# Patient Record
Sex: Male | Born: 1965 | Race: Black or African American | Hispanic: No | Marital: Single | Smoking: Never smoker
Health system: Southern US, Community
[De-identification: ages and names within clinical notes are randomized; demographics above are authoritative.]

## PROBLEM LIST (undated history)

## (undated) DIAGNOSIS — G4733 Obstructive sleep apnea (adult) (pediatric): Secondary | ICD-10-CM

## (undated) DIAGNOSIS — I10 Essential (primary) hypertension: Secondary | ICD-10-CM

## (undated) HISTORY — DX: Essential (primary) hypertension: I10

## (undated) HISTORY — DX: Obstructive sleep apnea (adult) (pediatric): G47.33

---

## 2001-07-02 ENCOUNTER — Encounter: Payer: Self-pay | Admitting: Emergency Medicine

## 2001-07-02 ENCOUNTER — Emergency Department (HOSPITAL_COMMUNITY): Admission: EM | Admit: 2001-07-02 | Discharge: 2001-07-02 | Payer: Self-pay | Admitting: Emergency Medicine

## 2002-12-02 ENCOUNTER — Emergency Department (HOSPITAL_COMMUNITY): Admission: EM | Admit: 2002-12-02 | Discharge: 2002-12-02 | Payer: Self-pay | Admitting: Emergency Medicine

## 2002-12-02 ENCOUNTER — Encounter: Payer: Self-pay | Admitting: Emergency Medicine

## 2004-10-01 ENCOUNTER — Ambulatory Visit: Payer: Self-pay | Admitting: Family Medicine

## 2005-03-21 ENCOUNTER — Ambulatory Visit: Payer: Self-pay | Admitting: Family Medicine

## 2005-04-12 ENCOUNTER — Ambulatory Visit: Payer: Self-pay | Admitting: Family Medicine

## 2005-10-31 ENCOUNTER — Ambulatory Visit: Payer: Self-pay | Admitting: Family Medicine

## 2006-01-03 ENCOUNTER — Ambulatory Visit: Payer: Self-pay | Admitting: Family Medicine

## 2006-02-20 ENCOUNTER — Ambulatory Visit: Payer: Self-pay | Admitting: Family Medicine

## 2006-04-22 ENCOUNTER — Ambulatory Visit: Payer: Self-pay | Admitting: Family Medicine

## 2006-05-05 ENCOUNTER — Ambulatory Visit: Payer: Self-pay | Admitting: Family Medicine

## 2006-07-15 ENCOUNTER — Ambulatory Visit: Payer: Self-pay | Admitting: Family Medicine

## 2007-01-14 ENCOUNTER — Ambulatory Visit: Payer: Self-pay | Admitting: Family Medicine

## 2007-02-03 ENCOUNTER — Ambulatory Visit: Payer: Self-pay | Admitting: Family Medicine

## 2007-08-26 DIAGNOSIS — I10 Essential (primary) hypertension: Secondary | ICD-10-CM

## 2007-08-26 DIAGNOSIS — G43909 Migraine, unspecified, not intractable, without status migrainosus: Secondary | ICD-10-CM | POA: Insufficient documentation

## 2008-01-21 ENCOUNTER — Telehealth: Payer: Self-pay | Admitting: Family Medicine

## 2008-06-14 ENCOUNTER — Ambulatory Visit: Payer: Self-pay | Admitting: Family Medicine

## 2008-06-14 DIAGNOSIS — J209 Acute bronchitis, unspecified: Secondary | ICD-10-CM

## 2008-08-05 ENCOUNTER — Telehealth: Payer: Self-pay | Admitting: Family Medicine

## 2008-08-05 ENCOUNTER — Encounter: Payer: Self-pay | Admitting: Family Medicine

## 2008-08-17 ENCOUNTER — Telehealth: Payer: Self-pay | Admitting: Family Medicine

## 2008-08-19 ENCOUNTER — Ambulatory Visit: Payer: Self-pay | Admitting: Family Medicine

## 2009-03-14 ENCOUNTER — Ambulatory Visit: Payer: Self-pay | Admitting: Family Medicine

## 2009-03-14 DIAGNOSIS — L723 Sebaceous cyst: Secondary | ICD-10-CM

## 2009-07-12 ENCOUNTER — Encounter: Payer: Self-pay | Admitting: Family Medicine

## 2009-09-01 ENCOUNTER — Telehealth: Payer: Self-pay | Admitting: Family Medicine

## 2010-01-25 ENCOUNTER — Ambulatory Visit: Payer: Self-pay | Admitting: Family Medicine

## 2010-09-07 ENCOUNTER — Ambulatory Visit: Payer: Self-pay | Admitting: Family Medicine

## 2010-09-07 ENCOUNTER — Encounter: Payer: Self-pay | Admitting: Family Medicine

## 2010-09-07 DIAGNOSIS — R000 Tachycardia, unspecified: Secondary | ICD-10-CM

## 2010-09-07 DIAGNOSIS — F411 Generalized anxiety disorder: Secondary | ICD-10-CM

## 2010-09-10 LAB — CONVERTED CEMR LAB
ALT: 28 units/L (ref 0–53)
AST: 26 units/L (ref 0–37)
Albumin: 4.1 g/dL (ref 3.5–5.2)
Alkaline Phosphatase: 81 units/L (ref 39–117)
BUN: 10 mg/dL (ref 6–23)
Basophils Absolute: 0 10*3/uL (ref 0.0–0.1)
Basophils Relative: 0.3 % (ref 0.0–3.0)
Bilirubin, Direct: 0.1 mg/dL (ref 0.0–0.3)
CO2: 33 meq/L — ABNORMAL HIGH (ref 19–32)
Calcium: 9.4 mg/dL (ref 8.4–10.5)
Chloride: 102 meq/L (ref 96–112)
Creatinine, Ser: 1.3 mg/dL (ref 0.4–1.5)
Eosinophils Absolute: 0.1 10*3/uL (ref 0.0–0.7)
Eosinophils Relative: 0.7 % (ref 0.0–5.0)
GFR calc non Af Amer: 77.56 mL/min (ref >60)
Glucose, Bld: 73 mg/dL (ref 70–99)
HCT: 46.9 % (ref 39.0–52.0)
Hemoglobin: 15.9 g/dL (ref 13.0–17.0)
Lymphocytes Relative: 21.1 % (ref 12.0–46.0)
Lymphs Abs: 2.4 10*3/uL (ref 0.7–4.0)
MCHC: 33.8 g/dL (ref 30.0–36.0)
MCV: 91.2 fL (ref 78.0–100.0)
Monocytes Absolute: 1 10*3/uL (ref 0.1–1.0)
Monocytes Relative: 8.5 % (ref 3.0–12.0)
Neutro Abs: 7.9 10*3/uL — ABNORMAL HIGH (ref 1.4–7.7)
Neutrophils Relative %: 69.4 % (ref 43.0–77.0)
Platelets: 249 10*3/uL (ref 150.0–400.0)
Potassium: 3.8 meq/L (ref 3.5–5.1)
RBC: 5.15 M/uL (ref 4.22–5.81)
RDW: 14.1 % (ref 11.5–14.6)
Sodium: 141 meq/L (ref 135–145)
TSH: 1.58 microintl units/mL (ref 0.35–5.50)
Total Bilirubin: 0.9 mg/dL (ref 0.3–1.2)
Total Protein: 7.7 g/dL (ref 6.0–8.3)
WBC: 11.4 10*3/uL — ABNORMAL HIGH (ref 4.5–10.5)

## 2010-12-13 NOTE — Assessment & Plan Note (Signed)
Summary: fatigue/irregular heartbeat/cjr   Vital Signs:  Patient profile:   45 year old male Weight:      293 pounds O2 Sat:      99 % Temp:     98.6 degrees F Pulse rate:   84 / minute BP sitting:   140 / 100  (left arm) Cuff size:   large  Vitals Entered By: Pura Spice, RN (September 07, 2010 1:54 PM) CC: under stress at work fatigue , states "almost tore bathroom down at work "  states feels like heart racing    History of Present Illness: Here complaining of one week of elevated BP and sensations of his heart racing. No SOB or chest pains. He has been under a lot of stress, both from family issues and work issues. His mother is ill with lymphoma and lung disease, and she has been in and out of the hospital for the past month. 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 In fact 2 days ago he got so upset at work that he went into a restroom and punched a stall door. Fortunately he was not injured. He has arranged with an EAP counselor to meet next Monday, and he wants to take a week off work. He slepps well but feels tired all the time. He plays some golf but gets no other exercise. His migraines have been worse lately, and he has been missing an average of one day of work a week due to his migraines.   Allergies: No Known Drug Allergies  Past History:  Past Medical History: Reviewed history from 01/25/2010 and no changes required. Hypertension migraine, sees the [facility]  Past Surgical History: Reviewed history from 06/14/2008 and no changes required. Denies surgical history  Review of Systems  The patient denies anorexia, fever, weight loss, weight gain, vision loss, decreased hearing, hoarseness, chest pain, syncope, dyspnea on exertion, peripheral edema, prolonged cough, hemoptysis, abdominal pain, melena, hematochezia, severe indigestion/heartburn, hematuria, incontinence, genital sores, muscle weakness, suspicious skin  lesions, transient blindness, difficulty walking, depression, unusual weight change, abnormal bleeding, enlarged lymph nodes, angioedema, breast masses, and testicular masses.    Physical Exam  General:  overweight-appearing.   Neck:  No deformities, masses, or tenderness noted. Lungs:  Normal respiratory effort, chest expands symmetrically. Lungs are clear to auscultation, no crackles or wheezes. Heart:  Normal rate and regular rhythm. S1 and S2 normal without gallop, murmur, click, rub or other extra sounds. EKG normal  Neurologic:  alert & oriented X3, cranial nerves II-XII intact, and gait normal.   Psych:  Oriented X3, memory intact for recent and remote, normally interactive, good eye contact, depressed affect, and tearful.     Impression & Recommendations:  Problem # 1:  TACHYCARDIA (ICD-785.0)  Orders: UA Dipstick w/o Micro (automated)  (81003) Venipuncture (98119) TLB-BMP (Basic Metabolic Panel-BMET) (80048-METABOL) TLB-CBC Platelet - w/Differential (85025-CBCD) TLB-Hepatic/Liver Function Pnl (80076-HEPATIC) TLB-TSH (Thyroid Stimulating Hormone) (84443-TSH)  Problem # 2:  ANXIETY STATE, UNSPECIFIED (ICD-300.00)  Problem # 3:  HYPERTENSION (ICD-401.9)  His updated medication list for this problem includes:    Benazepril-hydrochlorothiazide 20-25 Mg Tabs (Benazepril-hydrochlorothiazide) ..... Once daily  Complete Medication List: 1)  Benazepril-hydrochlorothiazide 20-25 Mg Tabs (Benazepril-hydrochlorothiazide) .... Once daily  Patient Instructions: 1)  We will write him out of work from today until 09-15-10. he will see the EAP counselor next week. I advised him to start on a medication  for anxiety and depression, but he  is resistant to this idea. He will think about it and get back with me. I think the palpitations are from stress and do not represent a true cardiac problem. We will continue to watch this closely.  2)  Please schedule a follow-up appointment in 2 weeks.     Orders Added: 1)  Est. Patient Level IV [91478] 2)  UA Dipstick w/o Micro (automated)  [81003] 3)  Venipuncture [36415] 4)  TLB-BMP (Basic Metabolic Panel-BMET) [80048-METABOL] 5)  TLB-CBC Platelet - w/Differential [85025-CBCD] 6)  TLB-Hepatic/Liver Function Pnl [80076-HEPATIC] 7)  TLB-TSH (Thyroid Stimulating Hormone) [29562-ZHY]  Appended Document: fatigue/irregular heartbeat/cjr  Laboratory Results   Urine Tests    Routine Urinalysis   Color: yellow Appearance: Clear Glucose: negative   (Normal Range: Negative) Bilirubin: negative   (Normal Range: Negative) Ketone: negative   (Normal Range: Negative) Spec. Gravity: 1.020   (Normal Range: 1.003-1.035) Blood: negative   (Normal Range: Negative) pH: 6.5   (Normal Range: 5.0-8.0) Protein: negative   (Normal Range: Negative) Urobilinogen: 0.2   (Normal Range: 0-1) Nitrite: negative   (Normal Range: Negative) Leukocyte Esterace: negative   (Normal Range: Negative)    Comments: Rita Ohara  September 07, 2010 2:46 PM

## 2010-12-13 NOTE — Assessment & Plan Note (Signed)
Summary: MED CK / REFILL // RS   Vital Signs:  Patient profile:   45 year old male Weight:      294 pounds BMI:     35.92 Temp:     98.4 degrees F oral BP sitting:   130 / 92  (left arm) Cuff size:   large  Vitals Entered By: Raechel Ache, RN (January 25, 2010 11:07 AM) CC: Med check. Had URI last week and still has cough.   History of Present Illness: Here for refills on BP meds. He has not checked his BP in a very long time. he feels fine. His weight is stable.  Allergies: No Known Drug Allergies  Past History:  Past Medical History: Hypertension migraine, sees the [facility]  Review of Systems  The patient denies anorexia, fever, weight loss, weight gain, vision loss, decreased hearing, hoarseness, chest pain, syncope, dyspnea on exertion, peripheral edema, prolonged cough, headaches, hemoptysis, abdominal pain, melena, hematochezia, severe indigestion/heartburn, hematuria, incontinence, genital sores, muscle weakness, suspicious skin lesions, transient blindness, difficulty walking, depression, unusual weight change, abnormal bleeding, enlarged lymph nodes, angioedema, breast masses, and testicular masses.    Physical Exam  General:  overweight-appearing.   Neck:  No deformities, masses, or tenderness noted. Lungs:  Normal respiratory effort, chest expands symmetrically. Lungs are clear to auscultation, no crackles or wheezes. Heart:  Normal rate and regular rhythm. S1 and S2 normal without gallop, murmur, click, rub or other extra sounds.   Impression & Recommendations:  Problem # 1:  HYPERTENSION (ICD-401.9)  The following medications were removed from the medication list:    Benazepril-hydrochlorothiazide 20-12.5 Mg Tabs (Benazepril-hydrochlorothiazide) ..... Once daily His updated medication list for this problem includes:    Benazepril-hydrochlorothiazide 20-25 Mg Tabs (Benazepril-hydrochlorothiazide) ..... Once daily  Complete Medication List: 1)   Benazepril-hydrochlorothiazide 20-25 Mg Tabs (Benazepril-hydrochlorothiazide) .... Once daily  Patient Instructions: 1)  It is important that you exercise reguarly at least 20 minutes 5 times a week. If you develop chest pain, have severe difficulty breathing, or feel very tired, stop exercising immediately and seek medical attention.  2)  You need to lose weight. Consider a lower calorie diet and regular exercise.  3)  Adjust meds as above. 4)  Set up a cpx soon Prescriptions: BENAZEPRIL-HYDROCHLOROTHIAZIDE 20-25 MG TABS (BENAZEPRIL-HYDROCHLOROTHIAZIDE) once daily  #90 x 3   Entered and Authorized by:   Nelwyn Salisbury MD   Signed by:   Nelwyn Salisbury MD on 01/25/2010   Method used:   Print then Give to Patient   RxID:   [id number]

## 2011-02-05 ENCOUNTER — Encounter: Payer: Self-pay | Admitting: Family Medicine

## 2011-02-06 ENCOUNTER — Encounter: Payer: Self-pay | Admitting: Family Medicine

## 2011-02-06 ENCOUNTER — Ambulatory Visit (INDEPENDENT_AMBULATORY_CARE_PROVIDER_SITE_OTHER): Payer: Federal, State, Local not specified - PPO | Admitting: Family Medicine

## 2011-02-06 VITALS — BP 126/84 | HR 86 | Temp 98.5°F | Wt 302.0 lb

## 2011-02-06 DIAGNOSIS — R5383 Other fatigue: Secondary | ICD-10-CM

## 2011-02-06 DIAGNOSIS — R5381 Other malaise: Secondary | ICD-10-CM

## 2011-02-06 DIAGNOSIS — G4730 Sleep apnea, unspecified: Secondary | ICD-10-CM

## 2011-02-06 LAB — TESTOSTERONE: Testosterone: 329.59 ng/dL — ABNORMAL LOW (ref 350.00–890.00)

## 2011-02-06 LAB — VITAMIN B12: Vitamin B-12: 329 pg/mL (ref 211–911)

## 2011-02-06 NOTE — Progress Notes (Signed)
  Subjective:    Patient ID: Frederick Ferrell, male    DOB: 1965-12-01, 45 y.o.   MRN: [id number]  HPI Here to follow up on migraines and chronic fatigue. He averages missing 8 days of work a month for HAs, and he needs FMLA forms filled out. He has been approved to see a [place] doctor for the migraines but he cannot get in to see them until late May. He continues to feel tired and sleep all the time, and we reviewed the normal cpx labs he had done here a few months ago. He says he sleeps all night long, but he has been told he seems restless and he snores a lot.    Review of Systems  Constitutional: Positive for fatigue.  Respiratory: Negative.   Cardiovascular: Negative.   Neurological: Positive for headaches.       Objective:   Physical Exam  Constitutional: He is oriented to person, place, and time. He appears well-developed and well-nourished.  Neck: No thyromegaly present.  Cardiovascular: Normal rate, regular rhythm, normal heart sounds and intact distal pulses.   Pulmonary/Chest: Effort normal and breath sounds normal.  Lymphadenopathy:    He has no cervical adenopathy.  Neurological: He is alert and oriented to person, place, and time.          Assessment & Plan:  More than likely he has sleep apnea, so we will refer him for a sleep evaluation. I filled out the FMLA forms, and he will follow up with the [place].

## 2011-02-07 ENCOUNTER — Telehealth: Payer: Self-pay

## 2011-02-07 LAB — VITAMIN D 25 HYDROXY (VIT D DEFICIENCY, FRACTURES): Vit D, 25-Hydroxy: 24 ng/mL — ABNORMAL LOW (ref 30–89)

## 2011-02-07 NOTE — Telephone Encounter (Signed)
Pt aware of lab results 

## 2011-02-07 NOTE — Telephone Encounter (Signed)
Message copied by Madison Hickman on Thu Feb 07, 2011  1:08 PM ------      Message from: Dwaine Deter      Created: Thu Feb 07, 2011  8:43 AM       B12 is normal. Vitamin D is low, so start taking 5000 units of OTC Vit. D a day. His testosterone is slightly low but not enough to make him fatigued. Proceed with the sleep apnea workup

## 2011-02-07 NOTE — Telephone Encounter (Signed)
Message copied by Madison Hickman on Thu Feb 07, 2011  3:16 PM ------      Message from: Dwaine Deter      Created: Thu Feb 07, 2011  8:43 AM       B12 is normal. Vitamin D is low, so start taking 5000 units of OTC Vit. D a day. His testosterone is slightly low but not enough to make him fatigued. Proceed with the sleep apnea workup

## 2011-02-28 ENCOUNTER — Other Ambulatory Visit: Payer: Self-pay

## 2011-02-28 NOTE — Telephone Encounter (Signed)
[facility] to [facility]

## 2011-03-01 ENCOUNTER — Encounter: Payer: Self-pay | Admitting: Pulmonary Disease

## 2011-03-01 MED ORDER — BENAZEPRIL-HYDROCHLOROTHIAZIDE 20-25 MG PO TABS: 1.0000 | ORAL_TABLET | Freq: Every day | ORAL | Status: DC

## 2011-03-05 ENCOUNTER — Ambulatory Visit (INDEPENDENT_AMBULATORY_CARE_PROVIDER_SITE_OTHER): Payer: Federal, State, Local not specified - PPO | Admitting: Pulmonary Disease

## 2011-03-05 ENCOUNTER — Encounter: Payer: Self-pay | Admitting: Pulmonary Disease

## 2011-03-05 VITALS — BP 120/80 | HR 64 | Temp 97.9°F | Ht 76.0 in | Wt 310.6 lb

## 2011-03-05 DIAGNOSIS — G4733 Obstructive sleep apnea (adult) (pediatric): Secondary | ICD-10-CM

## 2011-03-05 NOTE — Patient Instructions (Signed)
Will schedule for home sleep testing vs formal study at sleep center, depending on what insurance allows.  Work on [employer] loss Will arrange followup once results available.

## 2011-03-05 NOTE — Assessment & Plan Note (Signed)
The pt's history is very suggestive of sleep apnea, but may be just be symptomatic snoring.  He will need sleep study for diagnosis.  I have had a long discussion with the pt about sleep apnea, including its impact on QOL and CV health.  He is agreeable to pursuing workup.

## 2011-03-05 NOTE — Progress Notes (Signed)
  Subjective:    Patient ID: Frederick Ferrell, male    DOB: 1966-07-21, 45 y.o.   MRN: [id number]  HPI The pt is a 45y/o male who I have been asked to see for possible osa.  His history is + for the following: -witnessed apneas with loud snoring -nonrestorative sleep -denies significant EDS, but does feel "tired" all of the time -weight neutral over the last 2 yrs, epworth 8.  Sleep Questionnaire: What time do you typically go to bed?( Between what hours) 10:30 pm to 11:30 pm How long does it take you to fall asleep? instantly How many times during the night do you wake up? 1 What time do you get out of bed to start your day? 0500 Do you drive or operate heavy machinery in your occupation? No How much has your weight changed (up or down) over the past two years? (In pounds) 0 oz (0 kg) Have you ever had a sleep study before? No Do you currently use CPAP? No Do you wear oxygen at any time? No    Review of Systems  Constitutional: Negative for fever and unexpected weight change.  HENT: Negative for ear pain, nosebleeds, congestion, sore throat, rhinorrhea, sneezing, trouble swallowing, dental problem, postnasal drip and sinus pressure.   Eyes: Negative for redness and itching.  Respiratory: Positive for cough. Negative for chest tightness, shortness of breath and wheezing.   Cardiovascular: Negative for palpitations and leg swelling.  Gastrointestinal: Negative for nausea and vomiting.  Genitourinary: Negative for dysuria.  Musculoskeletal: Negative for joint swelling.  Skin: Negative for rash.  Neurological: Positive for headaches.  Hematological: Does not bruise/bleed easily.  Psychiatric/Behavioral: Negative for dysphoric mood. The patient is not nervous/anxious.        Objective:   Physical Exam Constitutional:  Overweight male, no acute distress  HENT:  Nares patent without discharge, mild enlargement of turbinates  Oropharynx without exudate, palate and uvula are moderately  elongated.  Eyes:  Perrla, eomi, no scleral icterus  Neck:  No JVD, no TMG  Cardiovascular:  Normal rate, regular rhythm, no rubs or gallops.  No murmurs        Intact distal pulses  Pulmonary :  Normal breath sounds, no stridor or respiratory distress   No rales, rhonchi, or wheezing  Abdominal:  Soft, nondistended, bowel sounds present.  No tenderness noted.   Musculoskeletal:  No lower extremity edema noted.  Lymph Nodes:  No cervical lymphadenopathy noted  Skin:  No cyanosis noted  Neurologic:  Alert, appropriate, moves all 4 extremities without obvious deficit.         Assessment & Plan:

## 2011-03-07 ENCOUNTER — Ambulatory Visit (INDEPENDENT_AMBULATORY_CARE_PROVIDER_SITE_OTHER): Payer: Federal, State, Local not specified - PPO | Admitting: Pulmonary Disease

## 2011-03-07 DIAGNOSIS — G4733 Obstructive sleep apnea (adult) (pediatric): Secondary | ICD-10-CM

## 2011-03-19 NOTE — Progress Notes (Signed)
The pt underwent home sleep testing with a type 3 monitoring device.  Airflow, effort, oximetry, and heart rate were all monitored.  Raw data and tracings have been reviewed with the following findings:  1) flow evaluation period of 7hrs and 2) 6 apneas and 39 hypopneas, for an overall AHI 5/hr 3) low saturation transiently to 73%, with spent <= 88%

## 2011-03-19 NOTE — Assessment & Plan Note (Signed)
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  If the patient feels he is symptomatic, could consider upper airway surgery, dental appliance, or cpap.  Clinical correlation is suggested.

## 2011-03-28 ENCOUNTER — Encounter: Payer: Self-pay | Admitting: Pulmonary Disease

## 2011-03-29 ENCOUNTER — Telehealth: Payer: Self-pay | Admitting: Pulmonary Disease

## 2011-03-29 NOTE — Telephone Encounter (Signed)
lmomtcb x1 

## 2011-04-01 NOTE — Telephone Encounter (Signed)
Spoke with pt. He states calling to let [facility] know that he is ready to try oral appliance for tx of OSA. Will forward to [facility] so that he is aware.

## 2011-04-02 ENCOUNTER — Other Ambulatory Visit: Payer: Self-pay | Admitting: Pulmonary Disease

## 2011-04-02 DIAGNOSIS — G4733 Obstructive sleep apnea (adult) (pediatric): Secondary | ICD-10-CM

## 2011-04-02 NOTE — Telephone Encounter (Signed)
Will send an order to [facility] for dental referral

## 2011-04-03 NOTE — Telephone Encounter (Signed)
Pt is aware referral is being made for dental appliance and [facility] will contact the pt regarding this.

## 2011-04-03 NOTE — Telephone Encounter (Signed)
LMOMTCB

## 2012-05-08 ENCOUNTER — Telehealth: Payer: Self-pay | Admitting: Family Medicine

## 2012-05-08 MED ORDER — BENAZEPRIL-HYDROCHLOROTHIAZIDE 20-12.5 MG PO TABS: 1.0000 | ORAL_TABLET | Freq: Every day | ORAL | Status: DC

## 2012-05-08 NOTE — Telephone Encounter (Signed)
I sent script e-scribe and spoke with pt. 

## 2012-05-08 NOTE — Telephone Encounter (Signed)
Patient called stating that his rx for benazapril has expired and his pharmacy is [facility] they have advised him to have something called in locally as he only has two pills left. [facility] [address] [facility] [place]. Please advise/assist and inform patient when done.

## 2012-05-08 NOTE — Telephone Encounter (Signed)
Call in #30 only. He needs an OV  

## 2012-06-11 ENCOUNTER — Other Ambulatory Visit: Payer: Self-pay | Admitting: Family Medicine

## 2012-06-11 NOTE — Telephone Encounter (Signed)
Can we refill this? See last script note.

## 2012-06-18 ENCOUNTER — Other Ambulatory Visit: Payer: Self-pay | Admitting: Family Medicine

## 2012-06-18 NOTE — Telephone Encounter (Signed)
Patient called that he is out of refill of his lotensin and [facility]. Please assist.

## 2012-06-18 NOTE — Telephone Encounter (Signed)
Can we refill this? 

## 2012-06-19 MED ORDER — BENAZEPRIL-HYDROCHLOROTHIAZIDE 20-12.5 MG PO TABS: 1.0000 | ORAL_TABLET | Freq: Every day | ORAL | Status: DC

## 2012-06-19 NOTE — Telephone Encounter (Signed)
done

## 2012-06-19 NOTE — Telephone Encounter (Signed)
Call in #30 only. He needs an OV  

## 2012-07-22 ENCOUNTER — Other Ambulatory Visit: Payer: Self-pay | Admitting: Family Medicine

## 2012-07-22 NOTE — Telephone Encounter (Signed)
I did send in script for 30 days only, also left a voice message. This is the last refill until pt is seen by the provider.

## 2012-07-27 ENCOUNTER — Encounter: Payer: Self-pay | Admitting: Family Medicine

## 2012-07-27 ENCOUNTER — Telehealth: Payer: Self-pay | Admitting: Family Medicine

## 2012-07-27 ENCOUNTER — Ambulatory Visit (INDEPENDENT_AMBULATORY_CARE_PROVIDER_SITE_OTHER): Payer: Federal, State, Local not specified - PPO | Admitting: Family Medicine

## 2012-07-27 VITALS — BP 134/90 | HR 81 | Temp 98.5°F | Wt 305.0 lb

## 2012-07-27 DIAGNOSIS — G43909 Migraine, unspecified, not intractable, without status migrainosus: Secondary | ICD-10-CM

## 2012-07-27 DIAGNOSIS — I10 Essential (primary) hypertension: Secondary | ICD-10-CM

## 2012-07-27 MED ORDER — BENAZEPRIL-HYDROCHLOROTHIAZIDE 20-25 MG PO TABS: 1.0000 | ORAL_TABLET | Freq: Every day | ORAL | Status: DC

## 2012-07-27 NOTE — Telephone Encounter (Signed)
I added medication to list.

## 2012-07-27 NOTE — Progress Notes (Signed)
  Subjective:    Patient ID: Frederick Ferrell, male    DOB: 1965/11/18, 46 y.o.   MRN: [id number]  HPI Here for refills. His BP has been stable when he gets it checked at the [facility] clinic. He has been seeing them there for his migraines, since these are still a problem. He averages one a week. They currently have him on a daily prevention med which sounds like Propranolol, but he is not sure of the name. It has not been working very well.   Review of Systems  Constitutional: Negative.   Respiratory: Negative.   Cardiovascular: Negative.   Neurological: Positive for headaches.       Objective:   Physical Exam  Constitutional: He is oriented to person, place, and time. He appears well-developed and well-nourished.  Cardiovascular: Normal rate, normal heart sounds and intact distal pulses.   Pulmonary/Chest: Effort normal and breath sounds normal.  Neurological: He is alert and oriented to person, place, and time.          Assessment & Plan:  We will increase the dose of his BP med a little. He will [facility] and [facility] us back. He needs to get more exercise.

## 2012-07-27 NOTE — Telephone Encounter (Signed)
Pt called and said that the name of the med is Verapamil HCL 80 mg. Pt takes 1 pill a day. Pt said that Dr Clent Ridges needed this info. [facility]

## 2012-11-09 ENCOUNTER — Encounter: Payer: Self-pay | Admitting: Family Medicine

## 2012-11-09 ENCOUNTER — Ambulatory Visit (INDEPENDENT_AMBULATORY_CARE_PROVIDER_SITE_OTHER): Payer: Federal, State, Local not specified - PPO | Admitting: Family Medicine

## 2012-11-09 VITALS — BP 148/80 | HR 89 | Temp 98.5°F | Wt 309.0 lb

## 2012-11-09 DIAGNOSIS — J069 Acute upper respiratory infection, unspecified: Secondary | ICD-10-CM

## 2012-11-09 MED ORDER — BENZONATATE 100 MG PO CAPS: 100.0000 mg | ORAL_CAPSULE | Freq: Three times a day (TID) | ORAL | Status: DC | PRN

## 2012-11-09 NOTE — Patient Instructions (Addendum)

## 2012-11-09 NOTE — Progress Notes (Signed)
Chief Complaint  Patient presents with  . Cough    body aches, sore throat    HPI: Acute visit for URI: -started: 3-4 days -symptoms:nasal congestion, sore throat, cough, body aches, diarrhea on day, HA -denies:fever, SOB, NV, tooth pain, ear painno recent strep, no flu or mono exposure -has tried: musinex, hydrocodone cough medication - gave him a headache -sick contacts: none known -Hx of: asthma   ROS: See pertinent positives and negatives per HPI.  Past Medical History  Diagnosis Date  . Hypertension   . Migraine     [facility]    Family History  Problem Relation Age of Onset  . Lymphoma Mother     non-hodgkin  . Hypertension      History   Social History  . Marital Status: Single    Spouse Name: pt is married.    Number of Children: Y  . Years of Education: N/A   Occupational History  . Postal Supervisor     Catering manager.   Social History Main Topics  . Smoking status: Never Smoker   . Smokeless tobacco: Never Used  . Alcohol Use: No  . Drug Use: No  . Sexually Active: None   Other Topics Concern  . None   Social History Narrative  . None    Current outpatient prescriptions:benazepril-hydrochlorthiazide (LOTENSIN HCT) 20-25 MG per tablet, Take 1 tablet by mouth daily., Disp: 90 tablet, Rfl: 3;  benzonatate (TESSALON PERLES) 100 MG capsule, Take 1 capsule (100 mg total) by mouth 3 (three) times daily as needed for cough., Disp: 20 capsule, Rfl: 0;  verapamil (CALAN) 80 MG tablet, Take 80 mg by mouth daily., Disp: , Rfl:   EXAM:  Filed Vitals:   11/09/12 1615  BP: 148/80  Pulse: 89  Temp: 98.5 F (36.9 C)    There is no height on file to calculate BMI.  GENERAL: vitals reviewed and listed above, alert, oriented, appears well hydrated and in no acute distress  HEENT: atraumatic, conjunttiva clear, no obvious abnormalities on inspection of external nose and ears, normal appearance of ear canals and TMs, clear nasal  congestion, mild post oropharyngeal erythema with PND, no tonsillar edema or exudate, no sinus TTP  NECK: no obvious masses on inspection  LUNGS: clear to auscultation bilaterally, no wheezes, rales or rhonchi, good air movement  CV: HRRR, no peripheral edema  MS: moves all extremities without noticeable abnormality  PSYCH: pleasant and cooperative, no obvious depression or anxiety  ASSESSMENT AND PLAN:  Discussed the following assessment and plan:  1. Upper respiratory infection  benzonatate (TESSALON PERLES) 100 MG capsule   -likely viral, supportive care and return precautions advised -Patient advised to return or notify a doctor immediately if symptoms worsen or persist or new concerns arise.  Patient Instructions  INSTRUCTIONS FOR UPPER RESPIRATORY INFECTION:  -plenty of rest and fluids  -nasal saline wash 2-3 times daily (use prepackaged nasal saline or bottled/distilled water if making your own)   -can use sinex nasal spray for drainage and nasal congestion - but do NOT use longer then 3-4 days  -can use tylenol or ibuprofen as directed for aches and sorethroat  -in the winter time, using a humidifier at night is helpful (please follow cleaning instructions)  -if you are taking a cough medication - use only as directed, may also try a teaspoon of honey to coat the throat and throat lozenges  -for sore throat, salt water gargles can help  -follow up  if you have fevers, facial pain, tooth pain, difficulty breathing or are worsening or not getting better in 5-7 days      Raeden Belzer, [facility]

## 2012-12-29 ENCOUNTER — Ambulatory Visit (INDEPENDENT_AMBULATORY_CARE_PROVIDER_SITE_OTHER): Payer: Federal, State, Local not specified - PPO | Admitting: Family Medicine

## 2012-12-29 ENCOUNTER — Encounter: Payer: Self-pay | Admitting: Family Medicine

## 2012-12-29 VITALS — BP 130/80 | HR 74 | Temp 98.2°F | Wt 307.0 lb

## 2012-12-29 DIAGNOSIS — I10 Essential (primary) hypertension: Secondary | ICD-10-CM

## 2012-12-29 DIAGNOSIS — G43909 Migraine, unspecified, not intractable, without status migrainosus: Secondary | ICD-10-CM

## 2012-12-29 NOTE — Progress Notes (Signed)
  Subjective:    Patient ID: Frederick Ferrell, male    DOB: 05-28-1966, 47 y.o.   MRN: [id number]  HPI Here to follow up on migraines and BP. He feels fine except for the migraines. He averages 8-10 a month and they last a full day on average. He had been seeing the [place] for these but asks me to take over caring for them. He has an FMLA form to fill out.    Review of Systems  Constitutional: Negative.   Respiratory: Negative.   Cardiovascular: Negative.   Neurological: Positive for headaches.       Objective:   Physical Exam  Constitutional: He appears well-developed and well-nourished.  Cardiovascular: Normal rate, regular rhythm, normal heart sounds and intact distal pulses.   Pulmonary/Chest: Effort normal and breath sounds normal.          Assessment & Plan:  His HTN is stable. Filled out [employer] form

## 2013-03-31 ENCOUNTER — Other Ambulatory Visit (INDEPENDENT_AMBULATORY_CARE_PROVIDER_SITE_OTHER): Payer: Federal, State, Local not specified - PPO

## 2013-03-31 DIAGNOSIS — Z0000 Encounter for general adult medical examination without abnormal findings: Secondary | ICD-10-CM

## 2013-03-31 LAB — HEPATIC FUNCTION PANEL
Albumin: 3.5 g/dL (ref 3.5–5.2)
Alkaline Phosphatase: 58 U/L (ref 39–117)
Total Bilirubin: 0.7 mg/dL (ref 0.3–1.2)

## 2013-03-31 LAB — CBC WITH DIFFERENTIAL/PLATELET
Basophils Relative: 0.6 % (ref 0.0–3.0)
Eosinophils Relative: 1.3 % (ref 0.0–5.0)
HCT: 43.7 % (ref 39.0–52.0)
Lymphs Abs: 2.4 10*3/uL (ref 0.7–4.0)
MCV: 88.8 fl (ref 78.0–100.0)
Monocytes Relative: 8.3 % (ref 3.0–12.0)
Neutrophils Relative %: 59.3 % (ref 43.0–77.0)
Platelets: 244 10*3/uL (ref 150.0–400.0)
RBC: 4.92 Mil/uL (ref 4.22–5.81)
WBC: 7.9 10*3/uL (ref 4.5–10.5)

## 2013-03-31 LAB — POCT URINALYSIS DIPSTICK
Glucose, UA: NEGATIVE
Leukocytes, UA: NEGATIVE
Urobilinogen, UA: 0.2

## 2013-03-31 LAB — BASIC METABOLIC PANEL
BUN: 14 mg/dL (ref 6–23)
Chloride: 105 mEq/L (ref 96–112)
GFR: 76.69 mL/min (ref >60.00)
Potassium: 4 mEq/L (ref 3.5–5.1)

## 2013-03-31 LAB — LIPID PANEL: Cholesterol: 164 mg/dL (ref 0–200)

## 2013-04-06 NOTE — Progress Notes (Signed)
Quick Note:  Pt has appointment on 04/07/13 will go over then. ______ 

## 2013-04-07 ENCOUNTER — Encounter: Payer: Self-pay | Admitting: Family Medicine

## 2013-04-07 ENCOUNTER — Ambulatory Visit (INDEPENDENT_AMBULATORY_CARE_PROVIDER_SITE_OTHER): Payer: Federal, State, Local not specified - PPO | Admitting: Family Medicine

## 2013-04-07 VITALS — BP 128/84 | HR 72 | Temp 99.2°F | Ht 74.5 in | Wt 303.0 lb

## 2013-04-07 DIAGNOSIS — Z0000 Encounter for general adult medical examination without abnormal findings: Secondary | ICD-10-CM

## 2013-04-07 NOTE — Progress Notes (Signed)
  Subjective:    Patient ID: Frederick Ferrell, male    DOB: 1966-10-09, 47 y.o.   MRN: [id number]  HPI 47 yr old male for a cpx. He feels well and has no concerns. He wears his CPAP at night. He plays golf once a week.    Review of Systems  Constitutional: Negative.   HENT: Negative.   Eyes: Negative.   Respiratory: Negative.   Cardiovascular: Negative.   Gastrointestinal: Negative.   Genitourinary: Negative.   Musculoskeletal: Negative.   Skin: Negative.   Neurological: Negative.   Psychiatric/Behavioral: Negative.        Objective:   Physical Exam  Constitutional: He is oriented to person, place, and time. He appears well-developed and well-nourished. No distress.  HENT:  Head: Normocephalic and atraumatic.  Right Ear: External ear normal.  Left Ear: External ear normal.  Nose: Nose normal.  Mouth/Throat: Oropharynx is clear and moist. No oropharyngeal exudate.  Eyes: Conjunctivae and EOM are normal. Pupils are equal, round, and reactive to light. Right eye exhibits no discharge. Left eye exhibits no discharge. No scleral icterus.  Neck: Neck supple. No JVD present. No tracheal deviation present. No thyromegaly present.  Cardiovascular: Normal rate, regular rhythm, normal heart sounds and intact distal pulses.  Exam reveals no gallop and no friction rub.   No murmur heard. Pulmonary/Chest: Effort normal and breath sounds normal. No respiratory distress. He has no wheezes. He has no rales. He exhibits no tenderness.  Abdominal: Soft. Bowel sounds are normal. He exhibits no distension and no mass. There is no tenderness. There is no rebound and no guarding.  Genitourinary: Rectum normal, prostate normal and penis normal. Guaiac negative stool. No penile tenderness.  Musculoskeletal: Normal range of motion. He exhibits no edema and no tenderness.  Lymphadenopathy:    He has no cervical adenopathy.  Neurological: He is alert and oriented to person, place, and time. He has normal  reflexes. No cranial nerve deficit. He exhibits normal muscle tone. Coordination normal.  Skin: Skin is warm and dry. No rash noted. He is not diaphoretic. No erythema. No pallor.  Psychiatric: He has a normal mood and affect. His behavior is normal. Judgment and thought content normal.          Assessment & Plan:  Well exam.

## 2013-08-30 ENCOUNTER — Other Ambulatory Visit: Payer: Self-pay | Admitting: Family Medicine

## 2013-11-30 ENCOUNTER — Ambulatory Visit (INDEPENDENT_AMBULATORY_CARE_PROVIDER_SITE_OTHER): Payer: Federal, State, Local not specified - PPO | Admitting: Family Medicine

## 2013-11-30 ENCOUNTER — Encounter: Payer: Self-pay | Admitting: Family Medicine

## 2013-11-30 VITALS — BP 130/80 | HR 74 | Temp 98.2°F | Ht 74.5 in | Wt 311.0 lb

## 2013-11-30 DIAGNOSIS — M545 Low back pain, unspecified: Secondary | ICD-10-CM

## 2013-11-30 MED ORDER — PREDNISONE 10 MG PO TABS: ORAL_TABLET | ORAL | Status: DC

## 2013-11-30 MED ORDER — CYCLOBENZAPRINE HCL 10 MG PO TABS: 10.0000 mg | ORAL_TABLET | Freq: Three times a day (TID) | ORAL | Status: DC | PRN

## 2013-11-30 MED ORDER — HYDROCODONE-ACETAMINOPHEN 10-325 MG PO TABS: 1.0000 | ORAL_TABLET | Freq: Four times a day (QID) | ORAL | Status: DC | PRN

## 2013-11-30 NOTE — Progress Notes (Signed)
Pre visit review using our clinic review tool, if applicable. No additional management support is needed unless otherwise documented below in the visit note. 

## 2013-11-30 NOTE — Progress Notes (Signed)
   Subjective:    Patient ID: Frederick SeveranceBrian Jake, male    DOB: 1966/06/06, 48 y.o.   MRN: [id number]  HPI Here for low back pain that started one week ago with sudden sharp left sided low back pain as he was lifting some golf clubs out of his car. He rested, used heat and took Advil and it seemed to go away for the most part. He still had some residual stiffness. He does not work out but he has been playing a lot of golf lately. Then this morning as he stepped off a curb onto his left foot, the same sharp severe pain returned in the left lower back. He drove himself here.    Review of Systems  Constitutional: Negative.   Musculoskeletal: Positive for back pain.       Objective:   Physical Exam  Constitutional:  In a lot of pain, walks very slowly   Musculoskeletal:  There is spasm in the left lower back with a lot of tenderness. Full ROM and negative SLR          Assessment & Plan:  He will rest and use heat. Try a steroid taper with Flexeril and Vicodin prn. Written out of work today through 12-02-13. Recheck prn

## 2014-05-09 ENCOUNTER — Ambulatory Visit (INDEPENDENT_AMBULATORY_CARE_PROVIDER_SITE_OTHER): Payer: Federal, State, Local not specified - PPO | Admitting: Family Medicine

## 2014-05-09 ENCOUNTER — Encounter: Payer: Self-pay | Admitting: Family Medicine

## 2014-05-09 VITALS — BP 132/80 | HR 74 | Temp 98.4°F | Ht 74.0 in | Wt 303.0 lb

## 2014-05-09 DIAGNOSIS — L03011 Cellulitis of right finger: Secondary | ICD-10-CM

## 2014-05-09 DIAGNOSIS — IMO0002 Reserved for concepts with insufficient information to code with codable children: Secondary | ICD-10-CM

## 2014-05-09 MED ORDER — CEPHALEXIN 500 MG PO CAPS: 500.0000 mg | ORAL_CAPSULE | Freq: Four times a day (QID) | ORAL | Status: AC

## 2014-05-09 NOTE — Progress Notes (Signed)
   Subjective:    Patient ID: Frederick Ferrell, male    DOB: January 13, 1966, 48 y.o.   MRN: [id number]  HPI Here for 3 days of severe pain in the right great toe. No recent trauma. He just returned from a beach trip. Warm Epsom salt soaks help a little.    Review of Systems  Constitutional: Negative.   Musculoskeletal: Positive for arthralgias and joint swelling.       Objective:   Physical Exam  Constitutional: He appears well-developed and well-nourished.  Limping   Musculoskeletal:  The right great toe is swollen, red, warm, and tender at the nail base. The joints are not tender.           Assessment & Plan:  Recheck prn. Written out of work for tomorrow

## 2014-05-09 NOTE — Progress Notes (Signed)
Pre visit review using our clinic review tool, if applicable. No additional management support is needed unless otherwise documented below in the visit note. 

## 2014-05-10 ENCOUNTER — Telehealth: Payer: Self-pay | Admitting: Family Medicine

## 2014-05-10 NOTE — Telephone Encounter (Signed)
Can we give another note for the dates below?

## 2014-05-10 NOTE — Telephone Encounter (Signed)
Pt was seen yesterday and has a return to work note from 6-29- rt wk on 05/11/14. Pt stated note was suppose to be from 6-29- rt wk on 05/12/14. Please correct and call pt

## 2014-05-12 NOTE — Telephone Encounter (Signed)
Pt is calling back stating he needs the note faxed or email asap. Pt states he went back to work today. Fax# [phone] or email to [email or web address]

## 2014-05-12 NOTE — Telephone Encounter (Signed)
Note was approved and faxed to below number. I left a voice message for pt with this information.

## 2014-06-20 ENCOUNTER — Ambulatory Visit (INDEPENDENT_AMBULATORY_CARE_PROVIDER_SITE_OTHER): Payer: Federal, State, Local not specified - PPO | Admitting: Family Medicine

## 2014-06-20 ENCOUNTER — Encounter: Payer: Self-pay | Admitting: Family Medicine

## 2014-06-20 VITALS — BP 122/73 | HR 60 | Temp 98.1°F | Ht 74.0 in | Wt 291.0 lb

## 2014-06-20 DIAGNOSIS — S76319A Strain of muscle, fascia and tendon of the posterior muscle group at thigh level, unspecified thigh, initial encounter: Secondary | ICD-10-CM | POA: Insufficient documentation

## 2014-06-20 DIAGNOSIS — S76311A Strain of muscle, fascia and tendon of the posterior muscle group at thigh level, right thigh, initial encounter: Secondary | ICD-10-CM

## 2014-06-20 DIAGNOSIS — IMO0002 Reserved for concepts with insufficient information to code with codable children: Secondary | ICD-10-CM

## 2014-06-20 MED ORDER — HYDROCODONE-ACETAMINOPHEN 10-325 MG PO TABS
1.0000 | ORAL_TABLET | Freq: Four times a day (QID) | ORAL | Status: DC | PRN
Start: 2014-06-20 — End: 2019-03-30

## 2014-06-20 MED ORDER — DICLOFENAC SODIUM 75 MG PO TBEC: 75.0000 mg | DELAYED_RELEASE_TABLET | Freq: Two times a day (BID) | ORAL | Status: DC

## 2014-06-20 NOTE — Progress Notes (Signed)
Pre visit review using our clinic review tool, if applicable. No additional management support is needed unless otherwise documented below in the visit note. 

## 2014-06-20 NOTE — Progress Notes (Signed)
   Subjective:    Patient ID: Frederick Ferrell, male    DOB: 07/01/1966, 48 y.o.   MRN: [id number]  HPI Here for a leg injury that occurred 2 days ago at home. He was coming home from a short exercise jog and his daughter wanted to race him home. After he sprinted a short distance he felt a sudden tearing sensation and a sharp pain in the right hamstring. He has had a lot of pain in this area ever since. Using ice and Advil.    Review of Systems  Constitutional: Negative.   Musculoskeletal: Positive for myalgias.       Objective:   Physical Exam  Constitutional:  In pain, limping  Musculoskeletal:  There is a moderate sized tear in the right hamstring with tenderness, not much swelling          Assessment & Plan:  Rest, ice, pain meds. He will avoid any running or prolonged walking for the next month. Recheck prn

## 2014-08-18 ENCOUNTER — Other Ambulatory Visit: Payer: Self-pay | Admitting: Family Medicine

## 2014-10-24 ENCOUNTER — Encounter: Payer: Self-pay | Admitting: Family Medicine

## 2014-10-24 ENCOUNTER — Ambulatory Visit (INDEPENDENT_AMBULATORY_CARE_PROVIDER_SITE_OTHER): Payer: Federal, State, Local not specified - PPO | Admitting: Family Medicine

## 2014-10-24 VITALS — BP 137/82 | HR 70 | Temp 98.3°F | Ht 74.0 in | Wt 293.0 lb

## 2014-10-24 DIAGNOSIS — M25512 Pain in left shoulder: Secondary | ICD-10-CM

## 2014-10-24 NOTE — Progress Notes (Signed)
   Subjective:    Patient ID: Frederick Ferrell, male    DOB: 1966-06-05, 48 y.o.   MRN: [id number]  HPI Here for one month of intermittent sharp pains in the anterior left shoulder. No hx of trauma. He also feels a lot of popping and cracking in the shoulder. None of the other joints bother him. He is able to play golf with no pain at all, but certain movements cause pain and it hurts to sleep on the left side. Advil and heat help a little. No pain radiates down the arm.    Review of Systems  Constitutional: Negative.   Musculoskeletal: Positive for arthralgias.       Objective:   Physical Exam  Constitutional: He appears well-developed and well-nourished. No distress.  Musculoskeletal:  He is tender in the anterior left shoulder. ROM is full but crepitus is present. He has pain on internal and external rotation.           Assessment & Plan:  Shoulder pain, possibly due to early arthritis. Try Diclofenac twice every day for several weeks. Rest and recheck prn

## 2014-10-24 NOTE — Progress Notes (Signed)
Pre visit review using our clinic review tool, if applicable. No additional management support is needed unless otherwise documented below in the visit note. 

## 2014-11-22 ENCOUNTER — Telehealth: Payer: Self-pay | Admitting: Family Medicine

## 2014-11-22 DIAGNOSIS — M25512 Pain in left shoulder: Secondary | ICD-10-CM

## 2014-11-22 NOTE — Telephone Encounter (Signed)
Pt was seen on 12/14 for shoulder pain and would like a referral to orthopaedic

## 2014-11-22 NOTE — Telephone Encounter (Signed)
done

## 2014-11-23 NOTE — Telephone Encounter (Signed)
I spoke with pt  

## 2014-11-30 ENCOUNTER — Other Ambulatory Visit: Payer: Self-pay | Admitting: Family Medicine

## 2014-12-11 ENCOUNTER — Other Ambulatory Visit: Payer: Self-pay | Admitting: Family Medicine

## 2015-05-09 ENCOUNTER — Other Ambulatory Visit: Payer: Self-pay | Admitting: Family Medicine

## 2015-06-13 ENCOUNTER — Encounter: Payer: Self-pay | Admitting: Family Medicine

## 2015-06-13 ENCOUNTER — Ambulatory Visit (INDEPENDENT_AMBULATORY_CARE_PROVIDER_SITE_OTHER): Payer: Federal, State, Local not specified - PPO | Admitting: Family Medicine

## 2015-06-13 VITALS — BP 124/88 | HR 70 | Temp 98.7°F | Ht 74.0 in | Wt 302.0 lb

## 2015-06-13 DIAGNOSIS — F411 Generalized anxiety disorder: Secondary | ICD-10-CM | POA: Diagnosis not present

## 2015-06-13 DIAGNOSIS — F329 Major depressive disorder, single episode, unspecified: Secondary | ICD-10-CM

## 2015-06-13 DIAGNOSIS — F32A Depression, unspecified: Secondary | ICD-10-CM

## 2015-06-13 MED ORDER — ALPRAZOLAM 0.5 MG PO TABS: 0.5000 mg | ORAL_TABLET | Freq: Three times a day (TID) | ORAL | Status: DC | PRN

## 2015-06-13 NOTE — Progress Notes (Signed)
Pre visit review using our clinic review tool, if applicable. No additional management support is needed unless otherwise documented below in the visit note. 

## 2015-06-13 NOTE — Progress Notes (Signed)
   Subjective:    Patient ID: Frederick Ferrell, male    DOB: 1965-11-25, 49 y.o.   MRN: [id number]  HPI Here to discuss anxiety and possible panic attacks. He has been treated for depression through the [place] for the past year, and in fact he is on disability for this. He had not had much of a problem with anxiety until a few months ago. Then he recalls an incident several weeks ago when he and his family got stuck in an elevator for about 20 minutes. At that time he had the acute onset of heart pounding, chest pains, and SOB and he felt like something very bad was about to happen to him. Once the elevator was repaired and they got out, he felt better. Since then he has had several other episodes like this but not as intense. He says driving his car is now difficult because he gets very anxious and feels SOB. E has been sleeping well. He has been taking Cymbalta and Trazadone for the past year.    Review of Systems  Constitutional: Negative.   Respiratory: Positive for chest tightness and shortness of breath. Negative for cough and wheezing.   Cardiovascular: Positive for chest pain and palpitations. Negative for leg swelling.  Gastrointestinal: Negative.   Neurological: Negative.   Psychiatric/Behavioral: Negative for hallucinations, behavioral problems, confusion, dysphoric mood, decreased concentration and agitation. The patient is nervous/anxious.        Objective:   Physical Exam  Constitutional: He is oriented to person, place, and time. He appears well-developed and well-nourished. No distress.  Neck: No thyromegaly present.  Cardiovascular: Normal rate, regular rhythm, normal heart sounds and intact distal pulses.   Pulmonary/Chest: Effort normal and breath sounds normal.  Lymphadenopathy:    He has no cervical adenopathy.  Neurological: He is alert and oriented to person, place, and time.  Psychiatric: His behavior is normal. Judgment and thought content normal.  A bit anxious            Assessment & Plan:  He seems to have developed some panic disorder on top of his depression, and this is triggered by classic scenarios like driving. He will try Xanax on an as needed basis. I also suggested cognitive therapy and he will decide whether to pursue this through the [place] or not. We spent 40 minutes face to face discussing these issues.

## 2015-07-13 ENCOUNTER — Other Ambulatory Visit: Payer: Self-pay | Admitting: Family Medicine

## 2015-07-13 NOTE — Telephone Encounter (Signed)
Call in #[phone] with 2 rf 

## 2015-08-22 ENCOUNTER — Other Ambulatory Visit: Payer: Self-pay | Admitting: Family Medicine

## 2015-08-25 ENCOUNTER — Other Ambulatory Visit: Payer: Self-pay | Admitting: Family Medicine

## 2016-01-12 ENCOUNTER — Telehealth: Payer: Self-pay | Admitting: Family Medicine

## 2016-01-12 MED ORDER — OSELTAMIVIR PHOSPHATE 75 MG PO CAPS: 75.0000 mg | ORAL_CAPSULE | Freq: Two times a day (BID) | ORAL | Status: DC

## 2016-01-12 NOTE — Telephone Encounter (Signed)
Call in Tamiflu 75 mg bid for 5 days  

## 2016-01-12 NOTE — Telephone Encounter (Signed)
I sent script e-scribe and spoke with pt. 

## 2016-01-12 NOTE — Telephone Encounter (Signed)
Pt states daughter diagnosed at [facility] last night with the flu. Advised pt to call his PCP and get rx for Tami flu. Pt has not had a flu shot. Pt does not have any symptoms at this time.  [facility] [address]

## 2016-03-13 ENCOUNTER — Other Ambulatory Visit: Payer: Self-pay | Admitting: Family Medicine

## 2016-06-13 ENCOUNTER — Other Ambulatory Visit: Payer: Self-pay | Admitting: Family Medicine

## 2016-06-13 ENCOUNTER — Other Ambulatory Visit: Payer: Self-pay | Admitting: Adult Health

## 2016-06-14 NOTE — Telephone Encounter (Signed)
Refill for 30 days. He needs an OV soon  

## 2016-06-14 NOTE — Telephone Encounter (Signed)
Patient last seen 06/13/15 - no upcoming appt. Ok to refill?

## 2016-06-14 NOTE — Telephone Encounter (Signed)
Refill for 30 days. He needs an OV soon

## 2016-08-05 ENCOUNTER — Other Ambulatory Visit: Payer: Self-pay | Admitting: Family Medicine

## 2016-09-03 ENCOUNTER — Other Ambulatory Visit: Payer: Self-pay | Admitting: Family Medicine

## 2016-09-07 ENCOUNTER — Other Ambulatory Visit: Payer: Self-pay | Admitting: Family Medicine

## 2016-10-07 ENCOUNTER — Other Ambulatory Visit: Payer: Self-pay | Admitting: Family Medicine

## 2016-11-10 ENCOUNTER — Other Ambulatory Visit: Payer: Self-pay | Admitting: Family Medicine

## 2016-11-12 NOTE — Telephone Encounter (Signed)
Patient needs to schedule an appointment for further refills °

## 2019-03-30 ENCOUNTER — Ambulatory Visit (INDEPENDENT_AMBULATORY_CARE_PROVIDER_SITE_OTHER): Payer: Federal, State, Local not specified - PPO | Admitting: Family Medicine

## 2019-03-30 ENCOUNTER — Encounter: Payer: Self-pay | Admitting: Family Medicine

## 2019-03-30 DIAGNOSIS — M79671 Pain in right foot: Secondary | ICD-10-CM | POA: Diagnosis not present

## 2019-03-30 DIAGNOSIS — I10 Essential (primary) hypertension: Secondary | ICD-10-CM | POA: Diagnosis not present

## 2019-03-30 DIAGNOSIS — M79672 Pain in left foot: Secondary | ICD-10-CM | POA: Diagnosis not present

## 2019-03-30 NOTE — Progress Notes (Signed)
Virtual Visit via Video Note   I connected with [name] on 03/31/19 at  3:30 PM EDT by a video enabled telemedicine application and verified that I am speaking with the correct person using two identifiers.  Location patient: home Location provider:home office Persons participating in the virtual visit: patient, provider  I discussed the limitations of evaluation and management by telemedicine and the availability of in person appointments. The patient expressed understanding and agreed to proceed.   HPI: [name] is a 53 yo AA male establishing care today. [facility],before if needed.  He has hx of HTN,anxiety,and OSA among some. He states that anxiety is not longer   Today he is c/o 3 months of feet pain, R>L,getting worse. Pain is on lateral aspect,where he has also noted "growths" Pain is achy,9/10, intermittent,exacerbated by closed shoe wear and alleviated by rest.  No Hx of trauma. No new shoes or more walking that usual.  [facility],he did not see his pcp but X ray and blood work were done.He has not received results,not sure which labs were done. [facility] but not until [facility]/2009.  It was prescribed Capsaicin cream but it is not helping.  HTN,he is on HCTZ 25 mg daily,Amlodipine 5 mg daily,and Lisinopril 20 mg daily. Dx 11+ years ago.  Denies severe/frequent headache, visual changes, chest pain, dyspnea, palpitation, claudication, focal weakness, or edema. He does not check BP regularly.  ROS: See pertinent positives and negatives per HPI.  Past Medical History:  Diagnosis Date  . Hypertension   . Migraine    [facility]  . Sleep apnea, obstructive    sees Dr. Shelle Ironlance     History reviewed. No pertinent surgical history.  Family History  Problem Relation Age of Onset  . Lymphoma Mother        non-hodgkin  . Hypertension Unknown     Social History   Socioeconomic History   . Marital status: Single    Spouse name: pt is married.  . Number of children: Y  . Years of education: Not on file  . Highest education level: Not on file  Occupational History  . Occupation: Engineer, productionostal Supervisor    Comment: [employer].  Social Needs  . Financial resource strain: Not on file  . Food insecurity:    Worry: Not on file    Inability: Not on file  . Transportation needs:    Medical: Not on file    Non-medical: Not on file  Tobacco Use  . Smoking status: Never Smoker  . Smokeless tobacco: Never Used  Substance and Sexual Activity  . Alcohol use: No    Alcohol/week: 0.0 standard drinks  . Drug use: No  . Sexual activity: Not on file  Lifestyle  . Physical activity:    Days per week: Not on file    Minutes per session: Not on file  . Stress: Not on file  Relationships  . Social connections:    Talks on phone: Not on file    Gets together: Not on file    Attends religious service: Not on file    Active member of club or organization: Not on file    Attends meetings of clubs or organizations: Not on file    Relationship status: Not on file  . Intimate partner violence:    Fear of current or ex partner: Not on file  Emotionally abused: Not on file    Physically abused: Not on file    Forced sexual activity: Not on file  Other Topics Concern  . Not on file  Social History Narrative  . Not on file      Current Outpatient Medications:  .  amLODipine (NORVASC) 5 MG tablet, Take 5 mg by mouth daily., Disp: , Rfl:  .  hydrochlorothiazide (HYDRODIURIL) 25 MG tablet, Take 25 mg by mouth daily., Disp: , Rfl:  .  lisinopril (ZESTRIL) 20 MG tablet, Take 20 mg by mouth daily., Disp: , Rfl:   EXAM:  VITALS per patient if applicable:N/A  GENERAL: alert, oriented, appears well and in no acute distress  HEENT: atraumatic, conjunctiva clear, no obvious abnormalities on inspection.  NECK: normal movements of the head and neck  LUNGS: on  inspection no signs of respiratory distress, breathing rate appears normal, no obvious gross SOB, gasping or wheezing  CV: no obvious cyanosis  MS: moves all visible extremities without noticeable abnormality. Feet with what seems to be a bony prominence, mid laleral aspect.See pictures.  PSYCH/NEURO: pleasant and cooperative, no obvious depression or anxiety, speech and thought processing grossly intact.         ASSESSMENT AND PLAN:  Discussed the following assessment and plan:  Bilateral foot pain Recommend wearing wider shoe. Name and number or podiatrist in the area given,he was instructed to call and arrange an appt, referral is not necessary.  Essential hypertension, benign No changes in current management. Recommend monitoring BP. Low salt diet recommended. Annual eye exam. Keep appt with PCP at the [place].   30 min face to face OV. > 50% was dedicated to discussion of differential Dx, prognosis,and treatment options.   I discussed the assessment and treatment plan with the patient. He was provided an opportunity to ask questions and all were answered. The patient agreed with the plan and demonstrated an understanding of the instructions.   The patient was advised to call back or seek an in-person evaluation if the symptoms worsen or if the condition fails to improve as anticipated.    Return if symptoms worsen or fail to improve.    Takara Sermons [place], MD

## 2019-04-01 ENCOUNTER — Ambulatory Visit (INDEPENDENT_AMBULATORY_CARE_PROVIDER_SITE_OTHER): Payer: Federal, State, Local not specified - PPO

## 2019-04-01 ENCOUNTER — Ambulatory Visit: Payer: Federal, State, Local not specified - PPO | Admitting: Podiatry

## 2019-04-01 ENCOUNTER — Other Ambulatory Visit: Payer: Self-pay | Admitting: Podiatry

## 2019-04-01 ENCOUNTER — Other Ambulatory Visit: Payer: Self-pay

## 2019-04-01 ENCOUNTER — Encounter: Payer: Self-pay | Admitting: Podiatry

## 2019-04-01 VITALS — BP 139/90 | HR 67 | Temp 97.6°F | Resp 16

## 2019-04-01 DIAGNOSIS — M7671 Peroneal tendinitis, right leg: Secondary | ICD-10-CM

## 2019-04-01 DIAGNOSIS — M79672 Pain in left foot: Secondary | ICD-10-CM | POA: Diagnosis not present

## 2019-04-01 DIAGNOSIS — M7670 Peroneal tendinitis, unspecified leg: Secondary | ICD-10-CM | POA: Diagnosis not present

## 2019-04-01 DIAGNOSIS — B351 Tinea unguium: Secondary | ICD-10-CM | POA: Diagnosis not present

## 2019-04-01 DIAGNOSIS — M79671 Pain in right foot: Secondary | ICD-10-CM

## 2019-04-01 MED ORDER — TRIAMCINOLONE ACETONIDE 10 MG/ML IJ SUSP
10.0000 mg | Freq: Once | INTRAMUSCULAR | Status: AC
  Administered 2019-04-01: 17:00:00 10 mg

## 2019-04-01 NOTE — Progress Notes (Signed)
   Subjective:    Patient ID: Frederick Ferrell, male    DOB: 1965-11-13, 53 y.o.   MRN: [id number]  HPI    Review of Systems  All other systems reviewed and are negative.      Objective:   Physical Exam        Assessment & Plan:

## 2019-04-02 NOTE — Progress Notes (Signed)
Subjective:   Patient ID: Frederick Ferrell, male   DOB: 53 y.o.   MRN: [id number]   HPI Patient presents with a lot of pain on the outside of his right over left foot and states that it is hard for him to wear shoe gear with.  Does not remember injury and he is very active and the right one is the one that is really bothering him and states he does not remember any kind of trauma that might have occurred.  Patient does not smoke and likes to be active.  Patient also complains of discolored nails that he would like to get treated at one point in future   Review of Systems  All other systems reviewed and are negative.       Objective:  Physical Exam Vitals signs and nursing note reviewed.  Constitutional:      Appearance: He is well-developed.  Pulmonary:     Effort: Pulmonary effort is normal.  Musculoskeletal: Normal range of motion.  Skin:    General: Skin is warm.  Neurological:     Mental Status: He is alert.     Neurovascular status intact muscle strength is adequate range of motion within normal limits with patient noted to have inflammation and pain of the outside of the right fifth metatarsal with fluid buildup at the insertion of the peroneal tendon into the base the fifth metatarsal.  There is some enlargement of the bone structure in this area and also mild on the left with minimal discomfort on the left.  Patient did not have muscle strength loss or other pathology and was noted to have good digital perfusion and is well oriented x3.  Patient also complains of yellow toenails     Assessment:  Peroneal tendinitis right with inflammation and possible bone structure issue with moderate change left and mycotic nail infection bilateral     Plan:  H&P x-rays reviewed conditions discussed.  At this point I will get a focus on the right and I did do sterile prep and then injected the peroneal insertion base of fifth metatarsal 3 mg Dexasone Kenalog 5 mg Xylocaine applied fascial  brace to lift up the lateral side of the foot and take pressure off it advised on reduced activity and utilization of ice.  We will discussed nail condition and possible treatment of left at next visit  X-ray indicates there is soft tissue inflammation around this area and I did not note significant bony structural change

## 2019-04-15 ENCOUNTER — Ambulatory Visit: Payer: Federal, State, Local not specified - PPO | Admitting: Podiatry

## 2019-04-15 ENCOUNTER — Encounter: Payer: Self-pay | Admitting: Podiatry

## 2019-04-15 ENCOUNTER — Other Ambulatory Visit: Payer: Self-pay

## 2019-04-15 VITALS — Temp 97.5°F

## 2019-04-15 DIAGNOSIS — M7671 Peroneal tendinitis, right leg: Secondary | ICD-10-CM | POA: Diagnosis not present

## 2019-04-15 DIAGNOSIS — B351 Tinea unguium: Secondary | ICD-10-CM

## 2019-04-15 MED ORDER — TRIAMCINOLONE ACETONIDE 10 MG/ML IJ SUSP
10.0000 mg | Freq: Once | INTRAMUSCULAR | Status: AC
  Administered 2019-04-15: 10 mg

## 2019-04-15 MED ORDER — TERBINAFINE HCL 250 MG PO TABS: 250.0000 mg | ORAL_TABLET | Freq: Every day | ORAL | 0 refills | Status: AC

## 2019-04-16 NOTE — Progress Notes (Signed)
Subjective:   Patient ID: Frederick Ferrell, male   DOB: 53 y.o.   MRN: [id number]   HPI Patient states that overall he is doing better but he continues to have an area that is sore around the insertion of the tendon into the bone and also he is concerned about the discoloration of his nailbeds   ROS      Objective:  Physical Exam  Neurovascular status intact with patient still find to have moderate inflammation around the peroneal insertion right with good muscle strength and reduce discomfort but still present especially in a more proximal dorsal direction.  Patient does have discoloration of several nails and they are thickened     Assessment:  Continued peroneal tendinitis right along with chronic mycotic nail infection bilateral     Plan:  H&P discussed conditions and at this time I did do a careful injection dorsal to where I did it before with 3 mg Dexasone Kenalog 5 mg Xylocaine advised on reduced activity ice therapy and then discussed the mycotic nail infection and we are going to start him on Lamisil that I educated him on today and I did find liver function that is normal for him.  Patient will be seen back I did explain this may or may not give him significant improvement of his nail

## 2019-05-06 ENCOUNTER — Ambulatory Visit: Payer: Federal, State, Local not specified - PPO | Admitting: Podiatry

## 2019-06-16 ENCOUNTER — Encounter (HOSPITAL_BASED_OUTPATIENT_CLINIC_OR_DEPARTMENT_OTHER): Payer: Self-pay

## 2019-06-16 ENCOUNTER — Other Ambulatory Visit: Payer: Self-pay

## 2019-06-16 ENCOUNTER — Emergency Department (HOSPITAL_BASED_OUTPATIENT_CLINIC_OR_DEPARTMENT_OTHER)
Admission: EM | Admit: 2019-06-16 | Discharge: 2019-06-16 | Disposition: A | Discharge status: Home / Self Care | Payer: No Typology Code available for payment source | Attending: Emergency Medicine | Admitting: Emergency Medicine

## 2019-06-16 ENCOUNTER — Emergency Department (HOSPITAL_BASED_OUTPATIENT_CLINIC_OR_DEPARTMENT_OTHER): Payer: No Typology Code available for payment source

## 2019-06-16 DIAGNOSIS — I10 Essential (primary) hypertension: Secondary | ICD-10-CM | POA: Diagnosis not present

## 2019-06-16 DIAGNOSIS — R2241 Localized swelling, mass and lump, right lower limb: Secondary | ICD-10-CM | POA: Diagnosis not present

## 2019-06-16 DIAGNOSIS — M25471 Effusion, right ankle: Secondary | ICD-10-CM

## 2019-06-16 DIAGNOSIS — M7989 Other specified soft tissue disorders: Secondary | ICD-10-CM

## 2019-06-16 DIAGNOSIS — Z79899 Other long term (current) drug therapy: Secondary | ICD-10-CM | POA: Diagnosis not present

## 2019-06-16 NOTE — ED Provider Notes (Signed)
[facility] EMERGENCY DEPARTMENT Provider Note   CSN: [id number] Arrival date & time: 06/16/19  1143     History   Chief Complaint Chief Complaint  Patient presents with  . Leg Swelling    HPI Frederick Ferrell is a 53 y.o. male.     HPI  53 year old male with a history of hypertension, obstructive sleep apnea, presents with concern for right lower extremity swelling.  Reports that his right ankle began swelling last week.  Denies any injury, travel, twisting.  Denies any pain.  Denies fevers, new medications, other joint swelling.  Denies chest pain or shortness of breath.  No history of DVT or PE.  Past Medical History:  Diagnosis Date  . Hypertension   . Migraine    [facility]  . Sleep apnea, obstructive    sees Dr. Shelle Ironlance     Patient Active Problem List   Diagnosis Date Noted  . Depression 06/13/2015  . Hamstring tear 06/20/2014  . OSA (obstructive sleep apnea) 03/05/2011  . Anxiety state 09/07/2010  . TACHYCARDIA 09/07/2010  . SEBACEOUS CYST 03/14/2009  . ACUTE BRONCHITIS 06/14/2008  . MIGRAINE HEADACHE 08/26/2007  . Essential hypertension, benign 08/26/2007    History reviewed. No pertinent surgical history.      Home Medications    Prior to Admission medications   Medication Sig Start Date End Date Taking? Authorizing Provider  amLODipine (NORVASC) 5 MG tablet Take 5 mg by mouth daily.    [provider]  hydrochlorothiazide (HYDRODIURIL) 25 MG tablet Take 25 mg by mouth daily.    [provider]  lisinopril (ZESTRIL) 20 MG tablet Take 20 mg by mouth daily.    [provider]  terbinafine (LAMISIL) 250 MG tablet Take 1 tablet (250 mg total) by mouth daily. 04/15/19   Lenn Sinkegal, Norman S, DPM    Family History Family History  Problem Relation Age of Onset  . Lymphoma Mother        non-hodgkin  . Hypertension Other     Social History Social History   Tobacco Use  . Smoking status: Never Smoker  . Smokeless  tobacco: Never Used  Substance Use Topics  . Alcohol use: No    Alcohol/week: 0.0 standard drinks  . Drug use: No     Allergies   Patient has no known allergies.   Review of Systems Review of Systems  Constitutional: Negative for fever.  HENT: Negative for sore throat.   Respiratory: Negative for shortness of breath.   Cardiovascular: Positive for leg swelling. Negative for chest pain.  Gastrointestinal: Negative for vomiting.  Musculoskeletal: Negative for arthralgias.  Skin: Negative for rash and wound.  Neurological: Negative for syncope and headaches.     Physical Exam Updated Vital Signs BP (!) 145/93 (BP Location: Left Arm)   Pulse 76   Temp 98.4 F (36.9 C) (Oral)   Resp 20   Ht 6\' 3"  (1.905 m)   Wt 136.1 kg   SpO2 100%   BMI 37.50 kg/m   Physical Exam Vitals signs and nursing note reviewed.  Constitutional:      General: He is not in acute distress.    Appearance: He is well-developed. He is not diaphoretic.  HENT:     Head: Normocephalic and atraumatic.  Eyes:     Conjunctiva/sclera: Conjunctivae normal.  Neck:     Musculoskeletal: Normal range of motion.  Cardiovascular:     Rate and Rhythm: Normal rate and regular rhythm.  Pulses: Normal pulses.  Pulmonary:     Effort: Pulmonary effort is normal. No respiratory distress.  Musculoskeletal:     Right ankle: He exhibits swelling. He exhibits normal range of motion, no deformity, no laceration and normal pulse. No tenderness. No lateral malleolus, no medial malleolus and no AITFL tenderness found.  Skin:    General: Skin is warm and dry.  Neurological:     Mental Status: He is alert and oriented to person, place, and time.      ED Treatments / Results  Labs (all labs ordered are listed, but only abnormal results are displayed) Labs Reviewed - No data to display  EKG None  Radiology US Venous Img Lower Right (dvt Study)  Result Date: 06/16/2019 CLINICAL DATA:  Right lower extremity  edema. EXAM: RIGHT LOWER EXTREMITY VENOUS DOPPLER ULTRASOUND TECHNIQUE: Gray-scale sonography with graded compression, as well as color Doppler and duplex ultrasound were performed to evaluate the lower extremity deep venous systems from the level of the common femoral vein and including the common femoral, femoral, profunda femoral, popliteal and calf veins including the posterior tibial, peroneal and gastrocnemius veins when visible. The superficial great saphenous vein was also interrogated. Spectral Doppler was utilized to evaluate flow at rest and with distal augmentation maneuvers in the common femoral, femoral and popliteal veins. COMPARISON:  None. FINDINGS: Contralateral Common Femoral Vein: Respiratory phasicity is normal and symmetric with the symptomatic side. No evidence of thrombus. Normal compressibility. Common Femoral Vein: No evidence of thrombus. Normal compressibility, respiratory phasicity and response to augmentation. Saphenofemoral Junction: No evidence of thrombus. Normal compressibility and flow on color Doppler imaging. Profunda Femoral Vein: No evidence of thrombus. Normal compressibility and flow on color Doppler imaging. Femoral Vein: No evidence of thrombus. Normal compressibility, respiratory phasicity and response to augmentation. Popliteal Vein: No evidence of thrombus. Normal compressibility, respiratory phasicity and response to augmentation. Calf Veins: No evidence of thrombus. Normal compressibility and flow on color Doppler imaging. Superficial Great Saphenous Vein: No evidence of thrombus. Normal compressibility. Suggestion of mild circumferential thickening in the calf and ankle which may be on the basis of prior thrombophlebitis. Venous Reflux:  None. Other Findings: No evidence of superficial thrombophlebitis or abnormal fluid collection. IMPRESSION: No evidence of right lower extremity deep venous thrombosis. Mild thickening of a patent right great saphenous vein in the  calf and ankle region may be on the basis of prior thrombophlebitis. No acute superficial thrombophlebitis is identified. Electronically Signed   By: Aletta Edouard M.D.   On: 06/16/2019 13:14    Procedures Procedures (including critical care time)  Medications Ordered in ED Medications - No data to display   Initial Impression / Assessment and Plan / ED Course  I have reviewed the triage vital signs and the nursing notes.  Pertinent labs & imaging results that were available during my care of the patient were reviewed by me and considered in my medical decision making (see chart for details).        53 year old male with a history of hypertension, obstructive sleep apnea, presents with concern for right lower extremity swelling.  He has no history of trauma, low suspicion for fracture or sprain.  He has normal distal pulses and tablets suspicion for acute intra-arterial thrombus.  Denies pain, have low suspicion for gout or septic arthritis.  DVT study was done which was negative for DVT, but showed concern for possible prior superficial thrombophlebitis.  He does not have signs of acute thrombophlebitis at this time.  Recommend continued supportive care and primary care physician follow-up. Patient discharged in stable condition with understanding of reasons to return.   Final Clinical Impressions(s) / ED Diagnoses   Final diagnoses:  Right leg swelling    ED Discharge Orders    None       Alvira MondaySchlossman, Bard Haupert, MD 06/17/19 2249

## 2019-06-16 NOTE — ED Triage Notes (Signed)
Pt c/o swelling to right LE x 1 week-denies injury-[facility]-NAD-steady gait

## 2019-06-16 NOTE — ED Notes (Signed)
Pt reports swelling to inside of R ankle, denies pain.

## 2019-09-01 ENCOUNTER — Telehealth: Payer: Self-pay | Admitting: Podiatry

## 2019-09-01 NOTE — Telephone Encounter (Signed)
I informed pt that he had received #90 therapeutic doses and it take 6-9 months to see healthy outgrowth, and if at 6-7 months he did not see a good amount of healthy nail at the base make an appt to be seen by Dr. Paulla Dolly and he may reorder.

## 2019-09-01 NOTE — Telephone Encounter (Signed)
Patient sent in an email via our website. It states: "I was prescribed an antibiotic at the [facility] office for nail fungus but the doctor wasnt sure if I had fungus or damaged nails. My nails have improved but I am out of the medication. Can I get another prescription?"

## 2019-09-29 ENCOUNTER — Other Ambulatory Visit: Payer: Self-pay

## 2019-09-29 ENCOUNTER — Ambulatory Visit: Payer: Federal, State, Local not specified - PPO | Admitting: Podiatry

## 2019-09-29 ENCOUNTER — Encounter: Payer: Self-pay | Admitting: Podiatry

## 2019-09-29 DIAGNOSIS — B351 Tinea unguium: Secondary | ICD-10-CM

## 2019-09-29 DIAGNOSIS — M7672 Peroneal tendinitis, left leg: Secondary | ICD-10-CM | POA: Diagnosis not present

## 2019-09-29 DIAGNOSIS — M7671 Peroneal tendinitis, right leg: Secondary | ICD-10-CM | POA: Diagnosis not present

## 2019-09-29 NOTE — Progress Notes (Signed)
Subjective:   Patient ID: Frederick Ferrell, male   DOB: 53 y.o.   MRN: [id number]   HPI Patient presents with inflammation of the peroneal base fifth metatarsal bilateral with abnormal pressure points against the lateral side of the foot   ROS      Objective:  Physical Exam  Patient who has inflammation pain at the insertion of the base of fifth metatarsal bilateral     Assessment:  H&P reviewed condition and did careful injections of the peroneal tendon complex at its insertion 3 mg Kenalog 5 mg Xylocaine and discussed orthotics and recommended long-term customized orthotic devices.  Patient will get approval for this and have these made     Plan:  Reviewed the chronic tendinitis condition and will be seen back to recheck and also to have orthotics made

## 2019-09-30 ENCOUNTER — Telehealth: Payer: Self-pay | Admitting: Podiatry

## 2019-09-30 NOTE — Telephone Encounter (Signed)
Spoke to pt about orthotic coverage. Pt does not [facility] on [facility] with our office therefore we cannot get the orthotics currently thru them until pt gets a referral to [place] from them. As far as [employer] they cover 70% which would leave about 120 pt responsibility. Pt is aware of both options and is going to contact the [employer] and got that route as of now.

## 2019-12-16 IMAGING — US RIGHT LOWER EXTREMITY VENOUS ULTRASOUND
1 series · 13 of 24 positions shown · non-contrast
Comparison: None.

CLINICAL DATA: Right lower extremity edema.



[Series 1: right lower extremity venous ultrasound · 13 of 35 slices shown]
[im 1/35]
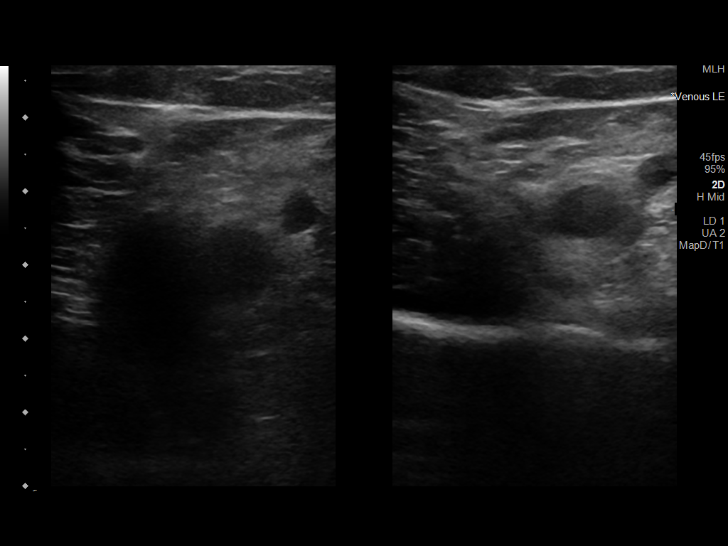
[im 3/35]
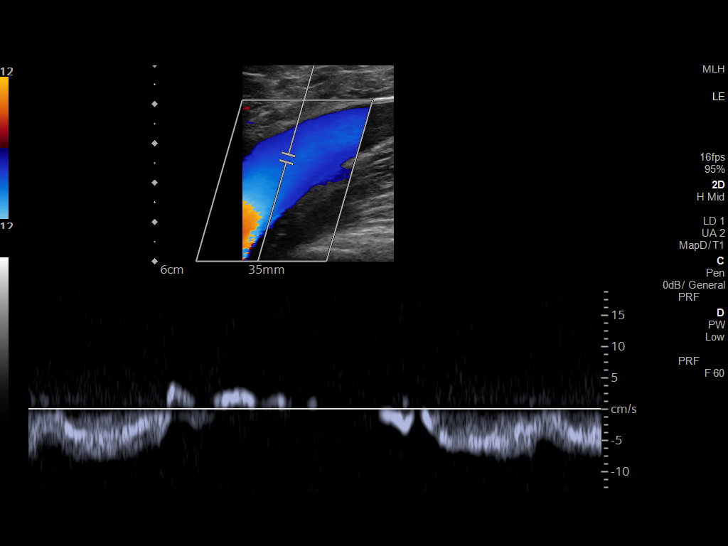
[im 6/35]
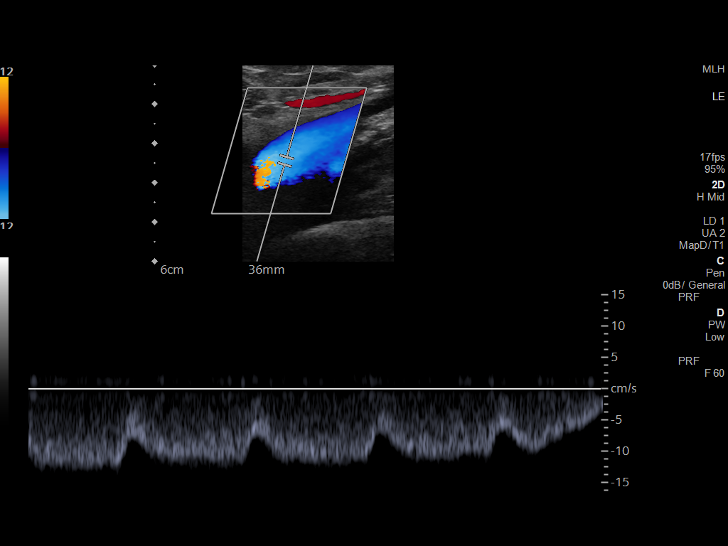
[im 9/35]
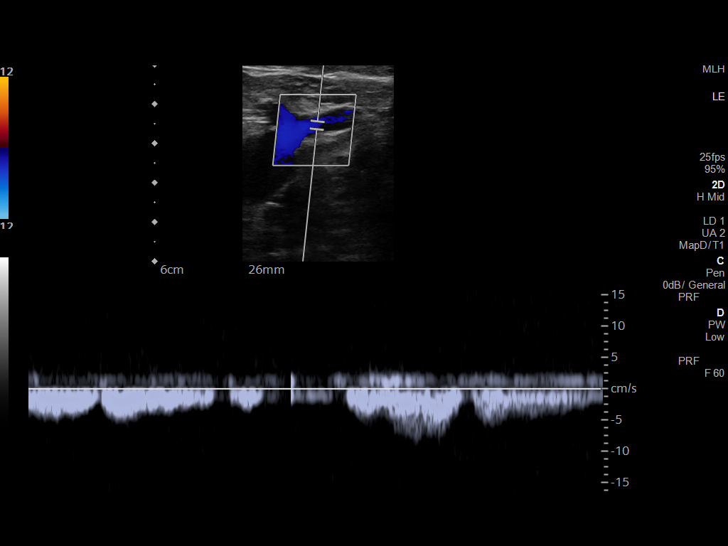
[im 12/35]
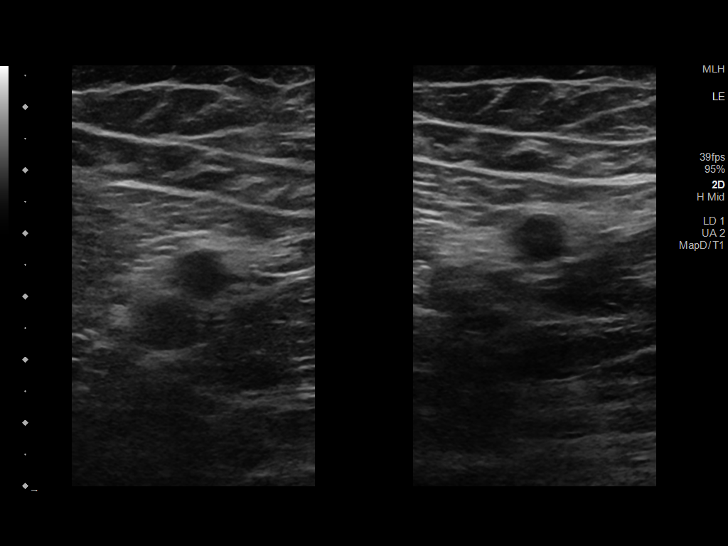
[im 15/35]
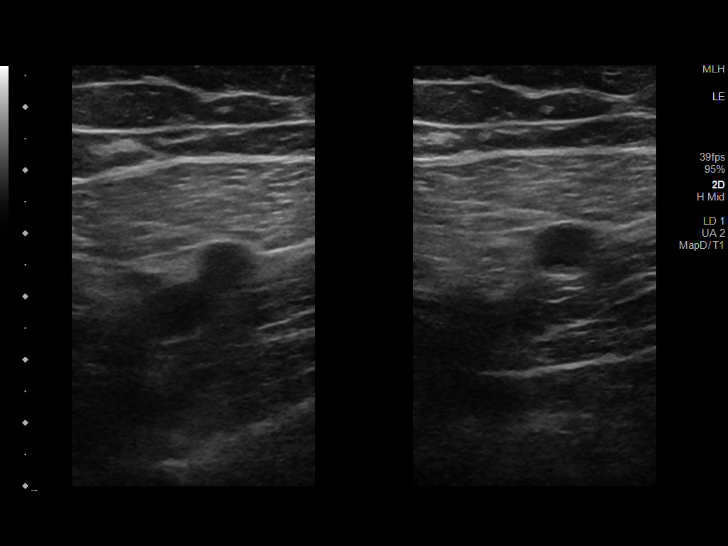
[im 18/35]
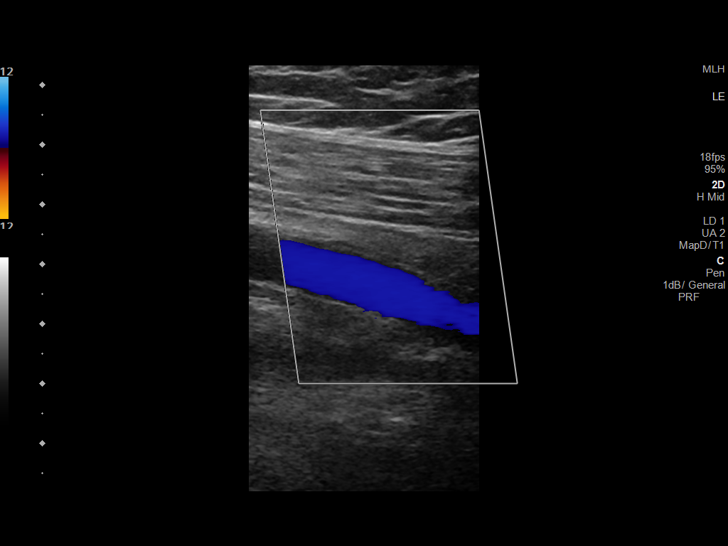
[im 20/35]
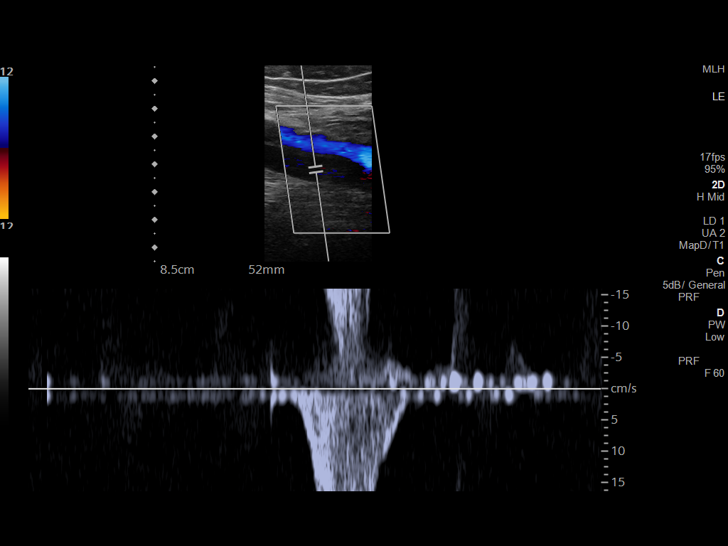
[im 23/35]
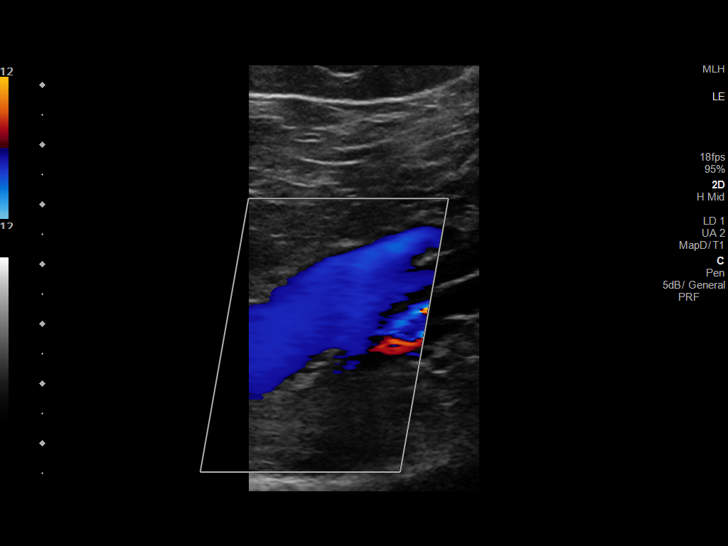
[im 26/35]
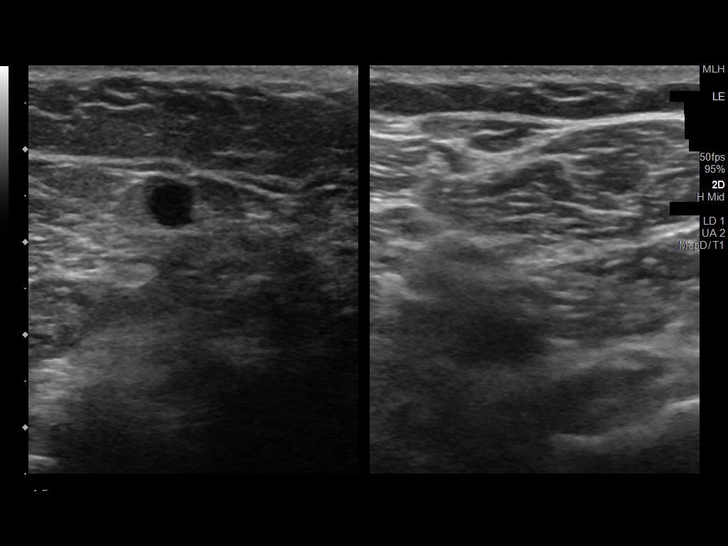
[im 29/35]
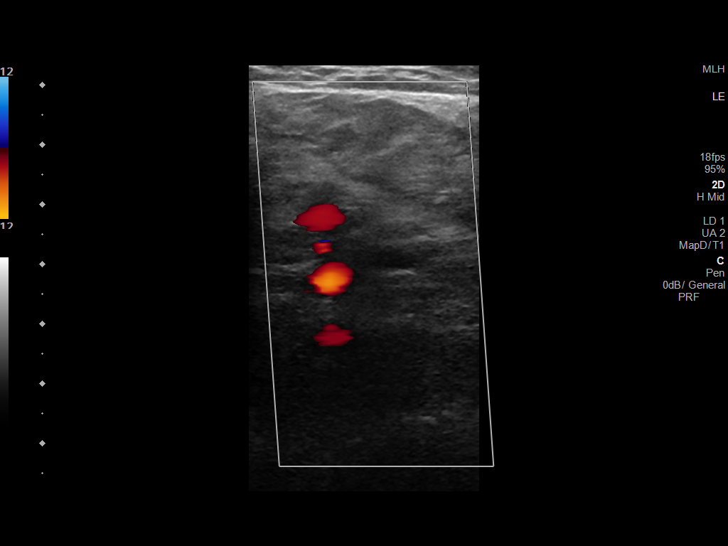
[im 32/35]
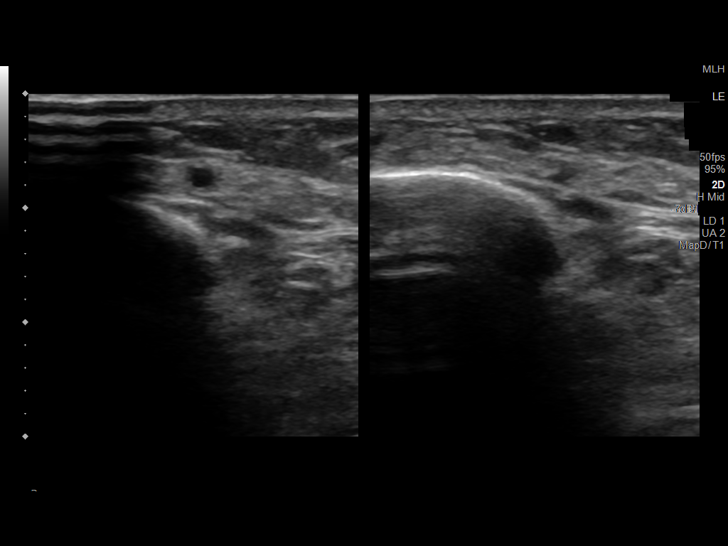
[im 35/35]
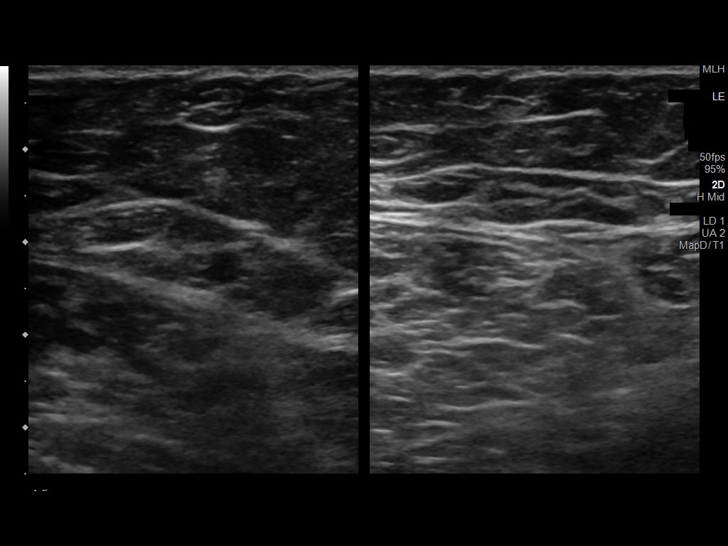

[13 of 24 positions shown; findings below may reference images not displayed]

FINDINGS: Contralateral Common Femoral Vein: Respiratory phasicity is normal
and symmetric with the symptomatic side. No evidence of thrombus.
Normal compressibility.

Common Femoral Vein: No evidence of thrombus. Normal
compressibility, respiratory phasicity and response to augmentation.

Saphenofemoral Junction: No evidence of thrombus. Normal
compressibility and flow on color Doppler imaging.

Profunda Femoral Vein: No evidence of thrombus. Normal
compressibility and flow on color Doppler imaging.

Femoral Vein: No evidence of thrombus. Normal compressibility,
respiratory phasicity and response to augmentation.

Popliteal Vein: No evidence of thrombus. Normal compressibility,
respiratory phasicity and response to augmentation.

Calf Veins: No evidence of thrombus. Normal compressibility and flow
on color Doppler imaging.

Superficial Great Saphenous Vein: No evidence of thrombus. Normal
compressibility. Suggestion of mild circumferential thickening in
the calf and ankle which may be on the basis of prior
thrombophlebitis.

Venous Reflux:  None.

Other Findings: No evidence of superficial thrombophlebitis or
abnormal fluid collection.
IMPRESSION: No evidence of right lower extremity deep venous thrombosis. Mild
thickening of a patent right great saphenous vein in the calf and
ankle region may be on the basis of prior thrombophlebitis. No acute
superficial thrombophlebitis is identified.

## 2020-04-24 ENCOUNTER — Ambulatory Visit: Payer: Federal, State, Local not specified - PPO | Admitting: Podiatry

## 2020-04-26 ENCOUNTER — Ambulatory Visit: Payer: Federal, State, Local not specified - PPO | Admitting: Podiatry

## 2020-04-26 ENCOUNTER — Other Ambulatory Visit: Payer: Self-pay

## 2020-04-26 ENCOUNTER — Encounter: Payer: Self-pay | Admitting: Podiatry

## 2020-04-26 DIAGNOSIS — B351 Tinea unguium: Secondary | ICD-10-CM | POA: Diagnosis not present

## 2020-04-26 DIAGNOSIS — M7671 Peroneal tendinitis, right leg: Secondary | ICD-10-CM | POA: Diagnosis not present

## 2020-04-26 MED ORDER — TERBINAFINE HCL 250 MG PO TABS: ORAL_TABLET | ORAL | 0 refills | Status: DC

## 2020-04-27 NOTE — Progress Notes (Signed)
Subjective:   Patient ID: Frederick Ferrell, male   DOB: 54 y.o.   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  I then discussed peroneal tendinitis and I recommended continue shoe gear modification ice as needed and anti-inflammatories and topical diclofenac on an as-needed basis

## 2020-05-08 ENCOUNTER — Telehealth: Payer: Self-pay | Admitting: Podiatry

## 2020-05-08 MED ORDER — TERBINAFINE HCL 250 MG PO TABS: ORAL_TABLET | ORAL | 0 refills | Status: AC

## 2020-05-08 NOTE — Telephone Encounter (Signed)
Left message informing pt the [facility] he [facility].

## 2020-05-08 NOTE — Telephone Encounter (Signed)
Pt emailed and requested that he had not [facility] and would now like for [facility] to [facility] [address] [facility]  Please assist  lamisil is  the medication

## 2020-05-08 NOTE — Addendum Note (Signed)
Addended by: Alphia Kava D on: 05/08/2020 11:50 AM   Modules accepted: Orders

## 2020-08-22 ENCOUNTER — Telehealth: Payer: Self-pay | Admitting: *Deleted

## 2020-08-22 NOTE — Telephone Encounter (Signed)
Patient is complaining that the new medication(terbinifine-250 mg ) does not seem to work as good as it did before in 2020.  His nails have become dark again, please schedule a f/u appointment w/  Physician(Dr. Charlsie Merles)

## 2020-08-24 NOTE — Telephone Encounter (Signed)
Lvm for patient to call and schedule in the [place] office

## 2020-08-25 ENCOUNTER — Encounter: Payer: Self-pay | Admitting: Podiatry

## 2020-08-25 ENCOUNTER — Other Ambulatory Visit: Payer: Self-pay

## 2020-08-25 ENCOUNTER — Ambulatory Visit: Payer: Federal, State, Local not specified - PPO | Admitting: Podiatry

## 2020-08-25 DIAGNOSIS — B351 Tinea unguium: Secondary | ICD-10-CM

## 2020-08-25 MED ORDER — TERBINAFINE HCL 250 MG PO TABS: 250.0000 mg | ORAL_TABLET | Freq: Every day | ORAL | 0 refills | Status: AC

## 2020-08-25 MED ORDER — TERBINAFINE HCL 250 MG PO TABS: 250.0000 mg | ORAL_TABLET | Freq: Every day | ORAL | 0 refills | Status: DC

## 2020-08-25 NOTE — Progress Notes (Signed)
Subjective:   Patient ID: Frederick Ferrell, male   DOB: 54 y.o.   MRN: [id number]   HPI Patient states he has had some reoccurrence of fungus in his toenails and he states that he did very well a few years ago with the oral medication but the dosage we put him on this time does not seem to be making a difference    ROS      Objective:  Physical Exam  Neurovascular status intact with patient nailbeds hallux showing distal thickness of the two thirds of the nailbed with mild proximal clearance after 4 months      Assessment:  Mycotic infection that is affecting the distal two thirds of the nailbed     Plan:  Reviewed condition and recommended that this patient continue conservatively and we will get a put him on 45 days of oral currently.  I explained it may take 6 months to a year to see complete improvement as best as we will be able to and we may not see complete improvement overall

## 2020-10-11 ENCOUNTER — Other Ambulatory Visit: Payer: Self-pay | Admitting: Podiatry

## 2020-10-12 NOTE — Telephone Encounter (Signed)
Please advise 

## 2020-10-13 NOTE — Telephone Encounter (Signed)
Looks like he has not filled. If this is refill he should not fill

## 2021-05-02 ENCOUNTER — Telehealth: Payer: Federal, State, Local not specified - PPO | Admitting: *Deleted

## 2021-05-02 NOTE — Telephone Encounter (Signed)
Patient calling for the name of the nail fungus medication prescribed by Dr Charlsie Merles.  Returned call back and gave him the name of the medication(Terbinafine,250mg ).

## 2022-12-23 ENCOUNTER — Other Ambulatory Visit (HOSPITAL_BASED_OUTPATIENT_CLINIC_OR_DEPARTMENT_OTHER): Payer: Self-pay

## 2022-12-23 ENCOUNTER — Emergency Department (HOSPITAL_BASED_OUTPATIENT_CLINIC_OR_DEPARTMENT_OTHER)
Admission: EM | Admit: 2022-12-23 | Discharge: 2022-12-23 | Disposition: A | Discharge status: Home / Self Care | Payer: No Typology Code available for payment source | Attending: Emergency Medicine | Admitting: Emergency Medicine

## 2022-12-23 ENCOUNTER — Encounter (HOSPITAL_BASED_OUTPATIENT_CLINIC_OR_DEPARTMENT_OTHER): Payer: Self-pay

## 2022-12-23 ENCOUNTER — Other Ambulatory Visit: Payer: Self-pay

## 2022-12-23 DIAGNOSIS — U071 COVID-19: Secondary | ICD-10-CM | POA: Diagnosis not present

## 2022-12-23 DIAGNOSIS — R059 Cough, unspecified: Secondary | ICD-10-CM | POA: Diagnosis present

## 2022-12-23 LAB — RESP PANEL BY RT-PCR (RSV, FLU A&B, COVID)  RVPGX2
Influenza A by PCR: NEGATIVE
Influenza B by PCR: NEGATIVE
Resp Syncytial Virus by PCR: NEGATIVE
SARS Coronavirus 2 by RT PCR: POSITIVE — AB

## 2022-12-23 MED ORDER — MOLNUPIRAVIR EUA 200MG CAPSULE
4.0000 | ORAL_CAPSULE | Freq: Two times a day (BID) | ORAL | 0 refills | Status: AC
  Filled 2022-12-23: qty 40, 5d supply, fill #0

## 2022-12-23 MED ORDER — HYDROCODONE BIT-HOMATROP MBR 5-1.5 MG/5ML PO SOLN
5.0000 mL | ORAL | 0 refills | Status: AC | PRN
  Filled 2022-12-23: qty 120, 4d supply, fill #0

## 2022-12-23 MED ORDER — ONDANSETRON 4 MG PO TBDP
4.0000 mg | ORAL_TABLET | ORAL | 0 refills | Status: AC | PRN
  Filled 2022-12-23: qty 20, 4d supply, fill #0

## 2022-12-23 MED ORDER — BENZONATATE 100 MG PO CAPS
200.0000 mg | ORAL_CAPSULE | Freq: Three times a day (TID) | ORAL | 0 refills | Status: AC | PRN
  Filled 2022-12-23: qty 21, 4d supply, fill #0

## 2022-12-23 NOTE — ED Provider Notes (Signed)
[facility] Provider Note   CSN: [id number] Arrival date & time: 12/23/22  1211     History  Chief Complaint  Patient presents with   Cough    Frederick Ferrell is a 57 y.o. male.  HPI Patient reports he started getting sick yesterday.  He developed a headache and nausea and diarrhea.  He then got body aches and chills as well.  He has since developed a cough.  No chest pain or shortness of breath.  Patient reports that he tested at home positive for COVID.  He reports he feels a little better today than he did yesterday.  He has been taking ibuprofen for fever and bodyaches.    Home Medications Prior to Admission medications   Medication Sig Start Date End Date Taking? Authorizing Provider  benzonatate (TESSALON) 100 MG capsule Take 2 capsules (200 mg total) by mouth 3 (three) times daily as needed for cough. 12/23/22  Yes Leonard Hendler, Jeannie Done, MD  HYDROcodone bit-homatropine (HYCODAN) 5-1.5 MG/5ML syrup Take 5 mLs by mouth every 4 (four) hours as needed for cough. 12/23/22  Yes Naisha Wisdom, Jeannie Done, MD  molnupiravir EUA (LAGEVRIO) 200 mg CAPS capsule Take 4 capsules (800 mg total) by mouth 2 (two) times daily for 5 days. 12/23/22 12/28/22 Yes Charlesetta Shanks, MD  ondansetron (ZOFRAN-ODT) 4 MG disintegrating tablet Take 1 tablet (4 mg total) by mouth every 4 (four) hours as needed for nausea or vomiting. 12/23/22  Yes Rishith Siddoway, Jeannie Done, MD  amLODipine (NORVASC) 5 MG tablet Take 5 mg by mouth daily.    [provider]  diazepam (VALIUM) 5 MG tablet Take 5 mg by mouth 3 (three) times daily as needed. 05/16/20   [provider]  hydrochlorothiazide (HYDRODIURIL) 25 MG tablet Take 25 mg by mouth daily.    [provider]  lisinopril (ZESTRIL) 20 MG tablet Take 20 mg by mouth daily.    [provider]  predniSONE (STERAPRED UNI-PAK 21 TAB) 10 MG (21) TBPK tablet Take by mouth. 05/16/20   [provider]  terbinafine (LAMISIL)  250 MG tablet Take 1 tablet (250 mg total) by mouth daily. 04/15/19   Wallene Huh, DPM  terbinafine (LAMISIL) 250 MG tablet Please take one a day x 7days, repeat every 4 weeks x 4 months 05/08/20   Wallene Huh, DPM  terbinafine (LAMISIL) 250 MG tablet Take 1 tablet (250 mg total) by mouth daily. 08/25/20   Wallene Huh, DPM  traMADol (ULTRAM) 50 MG tablet Take 50 mg by mouth every 6 (six) hours as needed. 05/16/20   [provider]      Allergies    Patient has no known allergies.    Review of Systems   Review of Systems  Physical Exam Updated Vital Signs BP (!) 140/71 (BP Location: Right Arm)   Pulse 87   Temp 97.8 F (36.6 C)   Resp 18   Ht 6' 3"$  (1.905 m)   Wt (!) 136.1 kg   SpO2 100%   BMI 37.50 kg/m  Physical Exam Constitutional:      Comments: Alert nontoxic well-nourished well-developed.  No respiratory distress.  Clear mental status.  HENT:     Mouth/Throat:     Pharynx: Oropharynx is clear.  Eyes:     Extraocular Movements: Extraocular movements intact.  Cardiovascular:     Rate and Rhythm: Normal rate and regular rhythm.  Pulmonary:     Effort: Pulmonary effort is normal.  Breath sounds: Normal breath sounds.  Abdominal:     General: There is no distension.     Palpations: Abdomen is soft.     Tenderness: There is no abdominal tenderness.  Musculoskeletal:        General: No swelling. Normal range of motion.     Right lower leg: No edema.     Left lower leg: No edema.  Skin:    General: Skin is warm and dry.  Neurological:     General: No focal deficit present.     Mental Status: He is oriented to person, place, and time.     Motor: No weakness.     Coordination: Coordination normal.  Psychiatric:        Mood and Affect: Mood normal.     ED Results / Procedures / Treatments   Labs (all labs ordered are listed, but only abnormal results are displayed) Labs Reviewed  RESP PANEL BY RT-PCR (RSV, FLU A&B, COVID)  [id number] - Abnormal;  Notable for the following components:      Result Value   SARS Coronavirus 2 by RT PCR POSITIVE (*)    All other components within normal limits    EKG None  Radiology No results found.  Procedures Procedures    Medications Ordered in ED Medications - No data to display  ED Course/ Medical Decision Making/ A&P                             Medical Decision Making Risk Prescription drug management.   Patient presents with typical symptoms of bodyaches, fever, chills, headache nausea and diarrhea with a home COVID test positive.  Patient's main medical comorbidity is hypertension.  COVID test positive in the emergency department.  Patient does not currently have chest pain or respiratory distress.  At this time I do not feel that he needs a chest x-ray.  Breath sounds are clear without any active wheezing.  Patient symptoms onset are less than 5 days with symptoms onset within the past 1 to 2 days.  Will prescribe for molnupiravir and symptomatic treatment with ibuprofen and Tylenol as well as Tessalon Perles and Hycodan syrup as needed.  Reviewed plan with the patient to follow-up at the [place] with his PCP and return precautions reviewed.        Final Clinical Impression(s) / ED Diagnoses Final diagnoses:  COVID    Rx / DC Orders ED Discharge Orders          Ordered    molnupiravir EUA (LAGEVRIO) 200 mg CAPS capsule  2 times daily        12/23/22 1517    ondansetron (ZOFRAN-ODT) 4 MG disintegrating tablet  Every 4 hours PRN        12/23/22 1517    benzonatate (TESSALON) 100 MG capsule  3 times daily PRN        12/23/22 1517    HYDROcodone bit-homatropine (HYCODAN) 5-1.5 MG/5ML syrup  Every 4 hours PRN        12/23/22 1517              Charlesetta Shanks, MD 12/23/22 1526

## 2022-12-23 NOTE — ED Notes (Signed)
RN provided AVS using Teachback Method. Patient verbalizes understanding of Discharge Instructions. Opportunity for Questioning and Answers were provided by RN. Patient Discharged from ED ambulatory to Home via Self.  

## 2022-12-23 NOTE — Discharge Instructions (Signed)
1.  Start your molnupiravir as instructions today. 2.  Take extra strength Tylenol every 6-8 hours for fever body aches.  You may also take ibuprofen 400 mg every 6-8 hours if you need additional medication for headache or fever.  Ibuprofen can elevate blood pressure in people who HAVE hypertension and cause problems with kidney function.  You may use ibuprofen for short-term needs but do not use it for more than 3-4 days at a time without close supervision by your doctor. 4.  You may take Tessalon Perles for cough.  Do not chew or suck on them.  Swallow them whole.  You may take Hycodan syrup for cough as well.  This medication is sedating.  Be sure you will not be driving or doing any dangerous activities if you take the Hycodan. 5.  Stay well-hydrated.  Return to the emergency department if you have new worsening or concerning symptoms.

## 2022-12-23 NOTE — ED Triage Notes (Signed)
Patient here POV from Home.  Endorses Chills, Headache, Cough, Chills, Fatigue that began yesterday.   Endorses Positive COVID-19 Home Test today. Sent by [place].  NAD Noted during Triage. A&Ox4. CGS 15. Ambulatory.

## 2024-02-23 ENCOUNTER — Encounter (HOSPITAL_BASED_OUTPATIENT_CLINIC_OR_DEPARTMENT_OTHER): Payer: Self-pay | Admitting: Internal Medicine

## 2024-02-23 DIAGNOSIS — G4733 Obstructive sleep apnea (adult) (pediatric): Secondary | ICD-10-CM

## 2024-05-03 ENCOUNTER — Ambulatory Visit (HOSPITAL_BASED_OUTPATIENT_CLINIC_OR_DEPARTMENT_OTHER): Attending: Family | Admitting: Internal Medicine

## 2024-05-03 DIAGNOSIS — G4733 Obstructive sleep apnea (adult) (pediatric): Secondary | ICD-10-CM | POA: Diagnosis present

## 2024-05-08 NOTE — Procedures (Signed)
 [facility] [address] [place], [place] [address] Tel: [phone]   Fax: [phone]  Polysomnography Interpretation  Patient Name:  Frederick Ferrell, Frederick Ferrell Date:  05/03/2024 Referring Physician:  Francis Balo, NP  Indications for Polysomnography The patient is a 58 year old Male who is 6' 3 and weighs 290.0 lbs. His BMI equals 36.4.  A full night polysomnogram was performed to evaluate for -OSA  Patient reported taking his medication at 9:15 pm.  Topiramate   Polysomnogram Data A full night polysomnogram recorded the standard physiologic parameters including EEG, EOG, EMG, EKG, nasal and oral airflow.  Respiratory parameters of chest and abdominal movements were recorded with Respiratory Inductance Plethysmography belts.  Oxygen saturation was recorded by pulse oximetry.   Sleep Architecture The total recording time of the polysomnogram was 367.8 minutes.  The total sleep time was 216.5 minutes.  The patient spent 10.4% of total sleep time in Stage N1, 83.4% in Stage N2, 1.4% in Stages N3, and 4.8% in REM.  Sleep latency was 27.3 minutes.  REM latency was 155.5 minutes.  Sleep Efficiency was 58.9%.  Wake after Sleep Onset time was 124.0 minutes.  Respiratory Events The polysomnogram revealed a presence of 1 obstructive, 4 centrals, and - mixed apneas resulting in an Apnea index of 1.4 events per hour.  There were 92 hypopneas (>=3% desaturation and/or arousal) resulting in an Apnea\Hypopnea Index (AHI >=3% desaturation and/or arousal) of 26.9 events per hour.  There were 47 hypopneas (>=4% desaturation) resulting in an Apnea\Hypopnea Index (AHI >=4% desaturation) of 14.4 events per hour.  There were 60 Respiratory Effort Related Arousals resulting in a RERA index of 16.6 events per hour. The Respiratory Disturbance Index is 43.5 events per hour.  The snore index was 28.5 events per hour.  Mean oxygen saturation was 94.7%.  The lowest oxygen  saturation during sleep was 84.0%.  Time spent <=88% oxygen saturation was 2.2 minutes (0.6%).  Limb Activity There were - total limb movements recorded, of this total, - were classified as PLMs.  PLM index was - per hour and PLM associated with Arousals index was - per hour.  Cardiac Summary The average pulse rate was 62.3 bpm.  The minimum pulse rate was 41.0 bpm while the maximum pulse rate was 88.0 bpm.  Cardiac rhythm was normal with frequent PVCs.  Comment: Moderate obstructive sleep apnea, AHI (3%) 26.9/hr. Snoring with oxygen desaturation to a nadir of 84%, mean 94.7%.  Diagnosis: Obstructive sleep apnea  Recommendations:  Autopap, CPAP titration sleep study or ENT evaluation considering hypoglossal nerve stimulator.   This study was personally reviewed and electronically signed by: Dr. Reggy Salt Accredited Board Certified in Sleep Medicine Date/Time: 05/08/24    11:48    Diagnostic PSG Report  Patient Name: Frederick Ferrell, Frederick Ferrell Date: 05/03/2024  Date of Birth: Jul 19, 1966 Study Type: Diagnostic  Age: 58 year MRN #: [id number]  Sex: Male Interpreting Physician: SALT REGGY, [id number]  Height: 6' 3 Referring Physician: Francis Balo, NP  Weight: 290.0 lbs Recording Tech: Orie Sires RRT RPSGT RST  BMI: 36.4 Scoring Tech: Orie Sires RRT RPSGT RST  ESS: 8 Neck Size: 16   Study Overview  Lights Off: 09:48:08 PM  Count Index  Lights On: 03:55:59 AM Awakenings: 24 6.7  Time in Bed: 367.8 min. Arousals: 109 30.2  Total Sleep Time: 216.5 min. AHI (>=3% Desat and/or Ar.): 97 26.9   Sleep Efficiency: 58.9% AHI (>=4% Desat): 52 14.4   Sleep Latency:  27.3 min. Limb Movements: - -  Wake After Sleep Onset: 124.0 min. Snore: 103 28.5  REM Latency from Sleep Onset: 155.5 min. Desaturations: 97 26.9     Minimum SpO2 TST: 84.0%    Sleep Architecture  % of Time in Bed Stages Time (mins) % Sleep Time  Wake 152.0   Stage N1 22.5 10.4%  Stage N2 180.5 83.4%  Stage N3 3.0 1.4%   REM 10.5 4.8%   Arousal Summary   NREM REM Sleep Index  Respiratory Arousals 81 4 85 23.6  PLM Arousals - - - -  Isolated Limb Movement Arousals - - - -  Snore Arousals - - - -  Spontaneous Arousals 21 3 24  6.7  Total 102 7 109 30.2   Limb Movement Summary   Count Index  Isolated Limb Movements - -  Periodic Limb Movements (PLMs) - -  Total Limb Movements - -    Respiratory Summary   By Sleep Stage By Body Position Total   NREM REM Supine Non-Supine   Time (min) 206.0 10.5 66.5 150.0 216.5         Obstructive Apnea 1 - - 1 1  Mixed Apnea - - - - -  Central Apnea 4 - - 4 4  Total Apneas 5 - - 5 5  Total Apnea Index 1.5 - - 2.0 1.4         Hypopneas (>=3% Desat and/or Ar.) 84 8 58 34 92  AHI (>=3% Desat and/or Ar.) 25.9 45.7 52.3 15.6 26.9         Hypopneas (>=4% Desat) 40 7 35 12 47  AHI (>=4% Desat) 13.1 40.0 31.6 6.8 14.4          RERAs 57 3 28 32 60  RERA Index 16.6 17.1 25.3 12.8 16.6         RDI 42.5 62.9 77.6 28.4 43.5    Respiratory Event Type Index  Central Apneas 1.1  Obstructive Apneas 0.3  Mixed Apneas -  Central Hypopneas -  Obstructive Hypopneas 25.5  Central Apnea + Hypopnea (CAHI) 1.1  Obstructive Apnea + Hypopnea (OAHI) 25.8   Respiratory Event Durations   Apnea Hypopnea   NREM REM NREM REM  Average (seconds) 20.7 - 29.3 32.1  Maximum (seconds) 35.1 - 61.0 51.7    Oxygen Saturation Summary   Wake NREM REM TST TIB  Average SpO2 (%) 95.8% 94.1% 91.6% 94.0% 94.7%  Minimum SpO2 (%) 83.0% 84.0% 85.0% 84.0% 83.0%  Maximum SpO2 (%) 100.0% 99.0% 97.0% 99.0% 100.0%   Oxygen Saturation Distribution  Range (%) Time in range (min) Time in range (%)  90.0 - 100.0 351.2 96.9%  80.0 - 90.0 11.3 3.1%  70.0 - 80.0 - -  60.0 - 70.0 - -  50.0 - 60.0 - -  0.0 - 50.0 - -  Time Spent <=88% SpO2  Range (%) Time in range (min) Time in range (%)  0.0 - 88.0 2.2 0.6%      Count Index  Desaturations 97 26.9    Cardiac Summary   Wake NREM  REM Sleep Total  Average Pulse Rate (BPM) 64.7 60.4 66.9 60.7 62.3  Minimum Pulse Rate (BPM) 41.0 43.0 62.0 43.0 41.0  Maximum Pulse Rate (BPM) 88.0 78.0 77.0 78.0 88.0   Pulse Rate Distribution:  Range (bpm) Time in range (min) Time in range (%)  0.0 - 40.0 - -  40.0 - 60.0 156.7 43.2%  60.0 - 80.0 201.2 55.4%  80.0 -  100.0 2.1 0.6%  100.0 - 120.0 - -  120.0 - 140.0 - -  140.0 - 200.0 - -      Hypnograms                      Technologist Comments  Patient was ordered as a NPSG only. Patient is a 58 year old African American male who was sent to the sleep center for OSA. No oxygen was applied. Patient reported taking his medication at 9:15 pm. A few Plm's/Plma's were noted. Snoring was noted to be moderate to occasionally loud. Questionable cardiac arrhythmias were noted pvc's see attached sheets and epochs for examples: 2, 4, 47, 137, 155, 178, 188, 265, 275, 277, 359, 428, 445, 465,538, 614, etc.   One restroom visit was noted.                           Reggy Salt Diplomate, Biomedical engineer of Sleep Medicine  ELECTRONICALLY SIGNED ON:  05/08/2024, 11:44 AM [facility] PH: [phone]   FX: [phone] ACCREDITED BY THE [employer] SLEEP [employer]IC[employer]

## 2024-06-15 NOTE — Procedures (Signed)
 Orders only
